# Patient Record
Sex: Female | Born: 1965 | Hispanic: No | Marital: Married | Smoking: Never smoker
Health system: Southern US, Community
[De-identification: ages and names within clinical notes are randomized; demographics above are authoritative.]

## PROBLEM LIST (undated history)

## (undated) DIAGNOSIS — R87613 High grade squamous intraepithelial lesion on cytologic smear of cervix (HGSIL): Secondary | ICD-10-CM

## (undated) DIAGNOSIS — N6019 Diffuse cystic mastopathy of unspecified breast: Secondary | ICD-10-CM

## (undated) DIAGNOSIS — G43829 Menstrual migraine, not intractable, without status migrainosus: Secondary | ICD-10-CM

## (undated) DIAGNOSIS — K635 Polyp of colon: Secondary | ICD-10-CM

## (undated) HISTORY — PX: COLPOSCOPY: SHX161

## (undated) HISTORY — DX: Diffuse cystic mastopathy of unspecified breast: N60.19

## (undated) HISTORY — DX: Polyp of colon: K63.5

## (undated) HISTORY — DX: Menstrual migraine, not intractable, without status migrainosus: G43.829

## (undated) HISTORY — DX: High grade squamous intraepithelial lesion on cytologic smear of cervix (HGSIL): R87.613

## (undated) HISTORY — PX: FOOT NEUROMA SURGERY: SHX646

---

## 1988-11-12 DIAGNOSIS — R87613 High grade squamous intraepithelial lesion on cytologic smear of cervix (HGSIL): Secondary | ICD-10-CM

## 1988-11-12 HISTORY — DX: High grade squamous intraepithelial lesion on cytologic smear of cervix (HGSIL): R87.613

## 1998-12-06 ENCOUNTER — Other Ambulatory Visit: Admission: RE | Admit: 1998-12-06 | Discharge: 1998-12-06 | Payer: Self-pay | Admitting: *Deleted

## 1999-07-14 ENCOUNTER — Other Ambulatory Visit: Admission: RE | Admit: 1999-07-14 | Discharge: 1999-07-14 | Payer: Self-pay | Admitting: *Deleted

## 1999-09-08 ENCOUNTER — Other Ambulatory Visit: Admission: RE | Admit: 1999-09-08 | Discharge: 1999-09-08 | Payer: Self-pay

## 2000-02-16 ENCOUNTER — Other Ambulatory Visit: Admission: RE | Admit: 2000-02-16 | Discharge: 2000-02-16 | Payer: Self-pay | Admitting: *Deleted

## 2000-12-13 ENCOUNTER — Other Ambulatory Visit: Admission: RE | Admit: 2000-12-13 | Discharge: 2000-12-13 | Payer: Self-pay | Admitting: *Deleted

## 2001-06-06 ENCOUNTER — Other Ambulatory Visit: Admission: RE | Admit: 2001-06-06 | Discharge: 2001-06-06 | Payer: Self-pay | Admitting: *Deleted

## 2002-03-19 ENCOUNTER — Other Ambulatory Visit: Admission: RE | Admit: 2002-03-19 | Discharge: 2002-03-19 | Payer: Self-pay | Admitting: *Deleted

## 2002-09-24 ENCOUNTER — Other Ambulatory Visit: Admission: RE | Admit: 2002-09-24 | Discharge: 2002-09-24 | Payer: Self-pay | Admitting: *Deleted

## 2003-09-08 ENCOUNTER — Encounter: Admission: RE | Admit: 2003-09-08 | Discharge: 2003-09-08 | Payer: Self-pay | Admitting: *Deleted

## 2003-09-29 ENCOUNTER — Other Ambulatory Visit: Admission: RE | Admit: 2003-09-29 | Discharge: 2003-09-29 | Payer: Self-pay | Admitting: Obstetrics and Gynecology

## 2004-10-12 ENCOUNTER — Other Ambulatory Visit: Admission: RE | Admit: 2004-10-12 | Discharge: 2004-10-12 | Payer: Self-pay | Admitting: Obstetrics and Gynecology

## 2005-12-13 ENCOUNTER — Other Ambulatory Visit: Admission: RE | Admit: 2005-12-13 | Discharge: 2005-12-13 | Payer: Self-pay | Admitting: Obstetrics & Gynecology

## 2005-12-28 ENCOUNTER — Encounter: Admission: RE | Admit: 2005-12-28 | Discharge: 2005-12-28 | Payer: Self-pay | Admitting: Obstetrics & Gynecology

## 2007-01-02 ENCOUNTER — Encounter: Admission: RE | Admit: 2007-01-02 | Discharge: 2007-01-02 | Payer: Self-pay | Admitting: Obstetrics and Gynecology

## 2007-01-04 ENCOUNTER — Other Ambulatory Visit: Admission: RE | Admit: 2007-01-04 | Discharge: 2007-01-04 | Payer: Self-pay | Admitting: Obstetrics & Gynecology

## 2007-04-15 HISTORY — PX: CERVICAL BIOPSY  W/ LOOP ELECTRODE EXCISION: SUR135

## 2007-04-16 ENCOUNTER — Ambulatory Visit (HOSPITAL_BASED_OUTPATIENT_CLINIC_OR_DEPARTMENT_OTHER): Admission: RE | Admit: 2007-04-16 | Discharge: 2007-04-16 | Payer: Self-pay | Admitting: Obstetrics and Gynecology

## 2007-04-16 ENCOUNTER — Encounter: Payer: Self-pay | Admitting: Obstetrics and Gynecology

## 2007-09-11 ENCOUNTER — Other Ambulatory Visit: Admission: RE | Admit: 2007-09-11 | Discharge: 2007-09-11 | Payer: Self-pay | Admitting: Obstetrics and Gynecology

## 2008-01-03 ENCOUNTER — Encounter: Admission: RE | Admit: 2008-01-03 | Discharge: 2008-01-03 | Payer: Self-pay | Admitting: Obstetrics and Gynecology

## 2008-01-14 ENCOUNTER — Other Ambulatory Visit: Admission: RE | Admit: 2008-01-14 | Discharge: 2008-01-14 | Payer: Self-pay | Admitting: Obstetrics and Gynecology

## 2008-05-18 ENCOUNTER — Other Ambulatory Visit: Admission: RE | Admit: 2008-05-18 | Discharge: 2008-05-18 | Payer: Self-pay | Admitting: Obstetrics and Gynecology

## 2008-11-26 ENCOUNTER — Other Ambulatory Visit: Admission: RE | Admit: 2008-11-26 | Discharge: 2008-11-26 | Payer: Self-pay | Admitting: Obstetrics and Gynecology

## 2009-05-18 ENCOUNTER — Encounter: Admission: RE | Admit: 2009-05-18 | Discharge: 2009-05-18 | Payer: Self-pay | Admitting: Obstetrics and Gynecology

## 2010-05-25 ENCOUNTER — Encounter: Admission: RE | Admit: 2010-05-25 | Discharge: 2010-05-25 | Payer: Self-pay | Admitting: Obstetrics and Gynecology

## 2010-12-27 NOTE — Op Note (Signed)
Vanessa Mckee, Vanessa Mckee               ACCOUNT NO.:  [id number]   MEDICAL RECORD NO.:  [id number]          PATIENT TYPE:  AMB   LOCATION:  [facility]                         FACILITY:  [facility]   PHYSICIAN:  Vanessa Mckee, M.D.DATE OF BIRTH:  Sep 04, 1965   DATE OF PROCEDURE:  04/16/2007  DATE OF DISCHARGE:                               OPERATIVE REPORT   PREOPERATIVE DIAGNOSIS:  Recurrent CIN-III.  Patient anxiety precludes  office procedure.   POSTOPERATIVE DIAGNOSIS:  Recurrent CIN-III.  Patient anxiety precludes  office procedure.  Path pending.   PROCEDURE PERFORMED:  Loupe electro excisional procedure.   SURGEON:  Vanessa Mckee, M.D.   ANESTHESIA:  General by laryngeal mask anesthesia.   ESTIMATED BLOOD LOSS:  Minimal.   COMPLICATIONS:  None.   PROCEDURE IN DETAIL:  The patient was taken to the operating room and  after the induction of general anesthesia by laryngeal mask anesthesia  was placed in the dorsal lithotomy position and prepped and draped in  the usual fashion.  A coated speculum was inserted and the cervix was  visualized.  Colposcopy was carried out.  The transformation zone was  clearly seen as were the abnormal areas that had been seen on previous  colposcopy.  Lugol solution was applied and the non-staining areas were  noted.  A 20 x 12 loop was used to excise the abnormal areas and the  entire transformation zone.  The settings on the machine were 60 cut and  50 coag.  The bed of the LEEP was then coagulated with a ball electrode  for hemostasis.  The specimen was opened at 9 o'clock and pinned out on  cork and sent to pathology.  The procedure was terminated.  The  instrument was removed from the patient's vagina and she went to the  recovery room in satisfactory condition.      Vanessa Mckee, M.D.  Electronically Signed     CPR/MEDQ  D:  04/16/2007  T:  04/16/2007  Job:  [id number]

## 2011-05-11 ENCOUNTER — Other Ambulatory Visit: Payer: Self-pay | Admitting: Obstetrics and Gynecology

## 2011-05-11 DIAGNOSIS — Z1231 Encounter for screening mammogram for malignant neoplasm of breast: Secondary | ICD-10-CM

## 2011-05-26 LAB — CBC
HCT: 42.1
Hemoglobin: 14.5
MCHC: 34.4
MCV: 86.6
Platelets: 163
RBC: 4.87
RDW: 12.7
WBC: 5.1

## 2011-05-26 LAB — POCT PREGNANCY, URINE
Operator id: 268271
Preg Test, Ur: NEGATIVE

## 2011-05-29 ENCOUNTER — Ambulatory Visit
Admission: RE | Admit: 2011-05-29 | Discharge: 2011-05-29 | Disposition: A | Discharge status: Home / Self Care | Payer: BC Managed Care – PPO | Source: Ambulatory Visit | Attending: Obstetrics and Gynecology | Admitting: Obstetrics and Gynecology

## 2011-05-29 DIAGNOSIS — Z1231 Encounter for screening mammogram for malignant neoplasm of breast: Secondary | ICD-10-CM

## 2012-06-21 ENCOUNTER — Other Ambulatory Visit: Payer: Self-pay | Admitting: Obstetrics and Gynecology

## 2012-06-21 DIAGNOSIS — Z1231 Encounter for screening mammogram for malignant neoplasm of breast: Secondary | ICD-10-CM

## 2012-08-02 ENCOUNTER — Ambulatory Visit
Admission: RE | Admit: 2012-08-02 | Discharge: 2012-08-02 | Disposition: A | Discharge status: Home / Self Care | Payer: BC Managed Care – PPO | Source: Ambulatory Visit | Attending: Obstetrics and Gynecology | Admitting: Obstetrics and Gynecology

## 2012-08-02 DIAGNOSIS — Z1231 Encounter for screening mammogram for malignant neoplasm of breast: Secondary | ICD-10-CM

## 2013-08-01 ENCOUNTER — Other Ambulatory Visit: Payer: Self-pay

## 2013-08-01 DIAGNOSIS — Z1231 Encounter for screening mammogram for malignant neoplasm of breast: Secondary | ICD-10-CM

## 2013-08-08 ENCOUNTER — Ambulatory Visit
Admission: RE | Admit: 2013-08-08 | Discharge: 2013-08-08 | Disposition: A | Discharge status: Home / Self Care | Payer: BC Managed Care – PPO | Source: Ambulatory Visit

## 2013-08-08 DIAGNOSIS — Z1231 Encounter for screening mammogram for malignant neoplasm of breast: Secondary | ICD-10-CM

## 2014-07-03 ENCOUNTER — Other Ambulatory Visit: Payer: Self-pay

## 2014-07-03 DIAGNOSIS — Z1231 Encounter for screening mammogram for malignant neoplasm of breast: Secondary | ICD-10-CM

## 2014-08-10 ENCOUNTER — Other Ambulatory Visit: Payer: Self-pay

## 2014-08-10 ENCOUNTER — Ambulatory Visit
Admission: RE | Admit: 2014-08-10 | Discharge: 2014-08-10 | Disposition: A | Discharge status: Home / Self Care | Payer: PRIVATE HEALTH INSURANCE | Source: Ambulatory Visit

## 2014-08-10 DIAGNOSIS — Z1231 Encounter for screening mammogram for malignant neoplasm of breast: Secondary | ICD-10-CM

## 2014-08-26 ENCOUNTER — Encounter: Payer: Self-pay | Admitting: Nurse Practitioner

## 2014-08-26 ENCOUNTER — Ambulatory Visit (INDEPENDENT_AMBULATORY_CARE_PROVIDER_SITE_OTHER): Payer: No Typology Code available for payment source | Admitting: Nurse Practitioner

## 2014-08-26 VITALS — BP 124/80 | HR 76 | Ht 62.0 in | Wt 132.0 lb

## 2014-08-26 DIAGNOSIS — Z01419 Encounter for gynecological examination (general) (routine) without abnormal findings: Secondary | ICD-10-CM

## 2014-08-26 DIAGNOSIS — Z0000 Encounter for general adult medical examination without abnormal findings: Secondary | ICD-10-CM

## 2014-08-26 LAB — POCT URINALYSIS DIPSTICK
Bilirubin, UA: NEGATIVE
Blood, UA: NEGATIVE
Glucose, UA: NEGATIVE
Ketones, UA: NEGATIVE
Leukocytes, UA: NEGATIVE
Nitrite, UA: NEGATIVE
Protein, UA: NEGATIVE
Urobilinogen, UA: NEGATIVE
pH, UA: 5

## 2014-08-26 LAB — HEMOGLOBIN, FINGERSTICK: Hemoglobin, fingerstick: 14.5 g/dL (ref 12.0–16.0)

## 2014-08-26 MED ORDER — ESTRADIOL 0.1 MG/24HR TD PTTW: 1.0000 | MEDICATED_PATCH | TRANSDERMAL | Status: DC

## 2014-08-26 NOTE — Patient Instructions (Signed)

## 2014-08-26 NOTE — Progress Notes (Signed)
Patient ID: Vanessa Mckee, female   DOB: 1966/08/14, 49 y.o.   MRN: [id number] 49 y.o. G0 Married  Caucasian Fe here for annual exam.  Menses lasting 2-3 days light to spotting.  Usually will take Vivelle patch 1 -2 times on off week of pills to help with migraine HA's.  Patient's last menstrual period was 08/19/2014.          Sexually active: Yes.    The current method of family planning is OCP (estrogen/progesterone).    Exercising: Yes.    Gym/ health club routine includes spin, weights, other cardio. Smoker:  no  Health Maintenance: Pap:  2 years ago, normal; history of CIN III with LEEP 04/2007, other abnormal see history MMG:  08/10/14, Bi-Rads 1:  Negative  TDaP:  01/14/08 Labs:  HB:  14.5  Urine:  Negative   reports that she has never smoked. She has never used smokeless tobacco. She reports that she does not drink alcohol or use illicit drugs.  Past Medical History  Diagnosis Date  . Fibrocystic breast disease (FCBD)   . Menstrual migraine   . Pap smear abnormality of cervix with HGSIL 4/90    CIN III laser vap  03/23/1989  . Pap smear abnormality of cervix with HGSIL 1/92 - 2/92    persistent CIN II treated with Efudex  . Pap smear abnormality of cervix with HGSIL 04/2007    LEEP for recurent CIN III    Past Surgical History  Procedure Laterality Date  . Cervical biopsy  w/ loop electrode excision  9/08    recurrent CIN-III  . Foot neuroma surgery Left   . Colposcopy      Current Outpatient Prescriptions  Medication Sig Dispense Refill  . ALPRAZolam (XANAX) 0.5 MG tablet Take 0.25 mg by mouth as needed.   0  . MONONESSA 0.25-35 MG-MCG tablet Take 1 tablet by mouth daily.  12  . estradiol (VIVELLE-DOT) 0.1 MG/24HR patch Place 1 patch (0.1 mg total) onto the skin 2 (two) times a week. 8 patch 12   No current facility-administered medications for this visit.    Family History  Problem Relation Age of Onset  . Hypertrophic cardiomyopathy Mother   . Hypertension  Mother   . Hypertension Father   . Heart disease Father   . Hypertrophic cardiomyopathy Maternal Aunt   . Hypertrophic cardiomyopathy Maternal Grandfather     unsure if diagnosed  . Heart failure Paternal Grandmother   . Cancer Paternal Grandfather 62    renal or stomach    ROS:  Pertinent items are noted in HPI.  Otherwise, a comprehensive ROS was negative.  Exam:   BP 124/80 mmHg  Pulse 76  Ht  (1.575 m)  Wt 132 lb (59.875 kg)  BMI 24.14 kg/m2  LMP 08/19/2014 Height:  (157.5 cm) Ht Readings from Last 3 Encounters:  08/26/14  (1.575 m)    General appearance: alert, cooperative and appears stated age Head: Normocephalic, without obvious abnormality, atraumatic Neck: no adenopathy, supple, symmetrical, trachea midline and thyroid normal to inspection and palpation Lungs: clear to auscultation bilaterally Breasts: normal appearance, no masses or tenderness Heart: regular rate and rhythm Abdomen: soft, non-tender; no masses,  no organomegaly Extremities: extremities normal, atraumatic, no cyanosis or edema Skin: Skin color, texture, turgor normal. No rashes or lesions Lymph nodes: Cervical, supraclavicular, and axillary nodes normal. No abnormal inguinal nodes palpated Neurologic: Grossly normal   Pelvic: External genitalia:  no lesions  Urethra:  normal appearing urethra with no masses, tenderness or lesions              Bartholin's and Skene's: normal                 Vagina: normal appearing vagina with normal color and discharge, no lesions              Cervix: anteverted              Pap taken: Yes.   Bimanual Exam:  Uterus:  normal size, contour, position, consistency, mobility, non-tender              Adnexa: no mass, fullness, tenderness               Rectovaginal: Confirms               Anus:  normal sphincter tone, no lesions  Chaperone present: Yes  A:  Well Woman with normal exam  Contraception with OCP  History of abnormal pap -  see history, last treatment wit LEEP 04/2007 for CIN III   P:   Reviewed health and wellness pertinent to exam  Pap smear taken today  Mammogram is due 07/2015  Refill on Monoessa for a year  Refill on Vivelle dot for a year  Counseled on breast self exam, mammography screening, use and side effects of OCP's, adequate intake of calcium and vitamin D, diet and exercise return annually or prn  An After Visit Summary was printed and given to the patient.

## 2014-08-30 NOTE — Progress Notes (Signed)
Encounter reviewed by Dr. Lacresha Fusilier Silva.  

## 2014-08-31 LAB — IPS PAP TEST WITH HPV

## 2014-11-09 ENCOUNTER — Other Ambulatory Visit: Payer: Self-pay | Admitting: Nurse Practitioner

## 2014-11-09 MED ORDER — NORGESTIMATE-ETH ESTRADIOL 0.25-35 MG-MCG PO TABS: 1.0000 | ORAL_TABLET | Freq: Every day | ORAL | Status: DC

## 2014-11-09 NOTE — Telephone Encounter (Signed)
Patient calling requesting refills on Mononessa. Pharmacy on file is correct.

## 2014-11-09 NOTE — Telephone Encounter (Signed)
Medication refill request: Mononessa Last AEX:  08/26/14 PG Next AEX: 09/02/15 PG Last MMG (if hormonal medication request): 08/10/14 BIRADS1:Neg Refill authorized: please advise

## 2014-11-30 ENCOUNTER — Telehealth: Payer: Self-pay | Admitting: Nurse Practitioner

## 2014-11-30 NOTE — Telephone Encounter (Signed)
Left message regarding upcoming appointment has been canceled and needs to be rescheduled. °

## 2015-07-20 ENCOUNTER — Other Ambulatory Visit: Payer: Self-pay

## 2015-07-20 DIAGNOSIS — Z1231 Encounter for screening mammogram for malignant neoplasm of breast: Secondary | ICD-10-CM

## 2015-08-25 ENCOUNTER — Ambulatory Visit
Admission: RE | Admit: 2015-08-25 | Discharge: 2015-08-25 | Disposition: A | Discharge status: Home / Self Care | Payer: BLUE CROSS/BLUE SHIELD | Source: Ambulatory Visit

## 2015-08-25 DIAGNOSIS — Z1231 Encounter for screening mammogram for malignant neoplasm of breast: Secondary | ICD-10-CM

## 2015-09-02 ENCOUNTER — Ambulatory Visit: Payer: No Typology Code available for payment source | Admitting: Nurse Practitioner

## 2015-09-08 ENCOUNTER — Ambulatory Visit (INDEPENDENT_AMBULATORY_CARE_PROVIDER_SITE_OTHER): Payer: BLUE CROSS/BLUE SHIELD | Admitting: Nurse Practitioner

## 2015-09-08 ENCOUNTER — Encounter: Payer: Self-pay | Admitting: Nurse Practitioner

## 2015-09-08 VITALS — BP 120/74 | HR 76 | Ht 62.0 in | Wt 131.0 lb

## 2015-09-08 DIAGNOSIS — Z0000 Encounter for general adult medical examination without abnormal findings: Secondary | ICD-10-CM

## 2015-09-08 DIAGNOSIS — Z01419 Encounter for gynecological examination (general) (routine) without abnormal findings: Secondary | ICD-10-CM

## 2015-09-08 DIAGNOSIS — Z1211 Encounter for screening for malignant neoplasm of colon: Secondary | ICD-10-CM

## 2015-09-08 LAB — COMPREHENSIVE METABOLIC PANEL
ALT: 16 U/L (ref 6–29)
AST: 19 U/L (ref 10–35)
Albumin: 4.1 g/dL (ref 3.6–5.1)
Alkaline Phosphatase: 48 U/L (ref 33–115)
BUN: 18 mg/dL (ref 7–25)
CO2: 26 mmol/L (ref 20–31)
Calcium: 8.9 mg/dL (ref 8.6–10.2)
Chloride: 103 mmol/L (ref 98–110)
Creat: 0.82 mg/dL (ref 0.50–1.10)
Glucose, Bld: 98 mg/dL (ref 65–99)
Potassium: 4.5 mmol/L (ref 3.5–5.3)
Sodium: 136 mmol/L (ref 135–146)
Total Bilirubin: 0.4 mg/dL (ref 0.2–1.2)
Total Protein: 6.6 g/dL (ref 6.1–8.1)

## 2015-09-08 LAB — LIPID PANEL
Cholesterol: 135 mg/dL (ref 125–200)
HDL: 59 mg/dL (ref >=46)
LDL Cholesterol: 51 mg/dL (ref <130)
Total CHOL/HDL Ratio: 2.3 Ratio (ref <=5.0)
Triglycerides: 126 mg/dL (ref <150)
VLDL: 25 mg/dL (ref <30)

## 2015-09-08 LAB — HIV ANTIBODY (ROUTINE TESTING W REFLEX): HIV 1&2 Ab, 4th Generation: NONREACTIVE

## 2015-09-08 LAB — CBC WITH DIFFERENTIAL/PLATELET
BASOS ABS: 0 10*3/uL (ref 0.0–0.1)
Basophils Relative: 0 % (ref 0–1)
EOS PCT: 1 % (ref 0–5)
Eosinophils Absolute: 0.1 10*3/uL (ref 0.0–0.7)
HEMATOCRIT: 42.4 % (ref 36.0–46.0)
HEMOGLOBIN: 14.8 g/dL (ref 12.0–15.0)
LYMPHS ABS: 1.1 10*3/uL (ref 0.7–4.0)
LYMPHS PCT: 15 % (ref 12–46)
MCH: 30.6 pg (ref 26.0–34.0)
MCHC: 34.9 g/dL (ref 30.0–36.0)
MCV: 87.8 fL (ref 78.0–100.0)
MPV: 10.2 fL (ref 8.6–12.4)
Monocytes Absolute: 0.5 10*3/uL (ref 0.1–1.0)
Monocytes Relative: 7 % (ref 3–12)
Neutro Abs: 5.8 10*3/uL (ref 1.7–7.7)
Neutrophils Relative %: 77 % (ref 43–77)
Platelets: 161 10*3/uL (ref 150–400)
RBC: 4.83 MIL/uL (ref 3.87–5.11)
RDW: 13.2 % (ref 11.5–15.5)
WBC: 7.5 10*3/uL (ref 4.0–10.5)

## 2015-09-08 LAB — HEMOGLOBIN, FINGERSTICK: Hemoglobin, fingerstick: 14 g/dL (ref 12.0–16.0)

## 2015-09-08 LAB — POCT URINALYSIS DIPSTICK
Bilirubin, UA: NEGATIVE
Blood, UA: NEGATIVE
Glucose, UA: NEGATIVE
KETONES UA: NEGATIVE
Leukocytes, UA: NEGATIVE
Nitrite, UA: NEGATIVE
PROTEIN UA: NEGATIVE
Urobilinogen, UA: NEGATIVE
pH, UA: 5

## 2015-09-08 LAB — TSH: TSH: 2.536 u[IU]/mL (ref 0.350–4.500)

## 2015-09-08 MED ORDER — ESTRADIOL 0.1 MG/24HR TD PTTW: 1.0000 | MEDICATED_PATCH | TRANSDERMAL | Status: DC

## 2015-09-08 MED ORDER — NORGESTIMATE-ETH ESTRADIOL 0.25-35 MG-MCG PO TABS: 1.0000 | ORAL_TABLET | Freq: Every day | ORAL | Status: DC

## 2015-09-08 MED ORDER — ALPRAZOLAM 0.5 MG PO TABS: 0.2500 mg | ORAL_TABLET | ORAL | Status: DC | PRN

## 2015-09-08 NOTE — Progress Notes (Signed)
Patient ID: Vanessa Mckee, female   DOB: [birth year], 50 y.o.   MRN: [id number]  50 y.o. G0P0 Married  Caucasian Fe here for annual exam.  Menses is usually 2-3 days and very light.  No cramps.  HA's are much better with use of Vivelle patch - usually only needs 1 patch per cycle.  Patient's last menstrual period was 08/18/2015 (exact date).          Sexually active: Yes.    The current method of family planning is OCP (estrogen/progesterone).    Exercising: Yes.    weights and cardio Smoker:  no  Health Maintenance: Pap: 08/26/14, negative with neg HR HPV; history of CIN III with LEEP 04/2007, other abnormal see history MMG:08/25/15, 3D, Bi-Rads 1: Negative  TDaP: 01/14/08  HIV: discussed today Labs: HB: 14.0, also wants fasting labs  Urine: Negative   reports that she has never smoked. She has never used smokeless tobacco. She reports that she does not drink alcohol or use illicit drugs.  Past Medical History  Diagnosis Date  . Fibrocystic breast disease (FCBD)   . Menstrual migraine   . Pap smear abnormality of cervix with HGSIL 4/90    CIN III laser vap  03/23/1989  . Pap smear abnormality of cervix with HGSIL 1/92 - 2/92    persistent CIN II treated with Efudex  . Pap smear abnormality of cervix with HGSIL 04/2007    LEEP for recurent CIN III    Past Surgical History  Procedure Laterality Date  . Cervical biopsy  w/ loop electrode excision  9/08    recurrent CIN-III  . Foot neuroma surgery Left   . Colposcopy      Current Outpatient Prescriptions  Medication Sig Dispense Refill  . ALPRAZolam (XANAX) 0.5 MG tablet Take 0.5 tablets (0.25 mg total) by mouth as needed. 30 tablet 0  . estradiol (VIVELLE-DOT) 0.1 MG/24HR patch Place 1 patch (0.1 mg total) onto the skin 2 (two) times a week. 8 patch 12  . norgestimate-ethinyl estradiol (MONONESSA) 0.25-35 MG-MCG tablet Take 1 tablet by mouth daily. 3 Package 4  . triamcinolone cream (KENALOG) 0.1 % as directed.  2   No  current facility-administered medications for this visit.    Family History  Problem Relation Age of Onset  . Hypertrophic cardiomyopathy Mother   . Hypertension Mother   . Hypertension Father   . Heart disease Father   . Hypertrophic cardiomyopathy Maternal Aunt   . Hypertrophic cardiomyopathy Maternal Grandfather     unsure if diagnosed  . Heart failure Paternal Grandmother   . Cancer Paternal Grandfather 58    renal or stomach    ROS:  Pertinent items are noted in HPI.  Otherwise, a comprehensive ROS was negative.  Exam:   BP 120/74 mmHg  Pulse 76  Ht  (1.575 m)  Wt 131 lb (59.421 kg)  BMI 23.95 kg/m2  LMP 08/18/2015 (Exact Date) Height:  (157.5 cm) Ht Readings from Last 3 Encounters:  09/08/15  (1.575 m)  08/26/14  (1.575 m)    General appearance: alert, cooperative and appears stated age Head: Normocephalic, without obvious abnormality, atraumatic Neck: no adenopathy, supple, symmetrical, trachea midline and thMASSIE Mckee to inspection and palpation Lungs: clear to auscultation bilaterally Breasts: normal appearance, no masses or tenderness Heart: regular rate and rhythm Abdomen: soft, non-tender; no masses,  no organomegaly Extremities: extremities normal, atraumatic, no cyanosis or edema Skin: Skin color, texture, turgor normal. No rashes or lesions  Lymph nodes: Cervical, supraclavicular, and axillary nodes normal. No abnormal inguinal nodes palpated Neurologic: Grossly normal   Pelvic: External genitalia:  no lesions              Urethra:  normal appearing urethra with no masses, tenderness or lesions              Bartholin's and Skene's: normal                 Vagina: normal appearing vagina with normal color and discharge, no lesions              Cervix: anteverted              Pap taken: No. Bimanual Exam:  Uterus:  normal size, contour, position, consistency, mobility, non-tender              Adnexa: no mass, fullness, tenderness                Rectovaginal: Confirms               Anus:  normal sphincter tone, no lesions  Chaperone present: no  A:  Well Woman with normal exam  Contraception with OCP  History of migraine HA's with menses - does well on Vivelle patch. History of abnormal pap - see history, last treatment wit LEEP 04/2007 for CIN III   P:   Reviewed health and wellness pertinent to exam  Pap smear as above  Mammogram is due 08/2016  Refill on [facility] for a year  Refill on Vivelle patch to use only prn during menses  Refill on Xanax 0.5 mg to use only 1/2 tab with travel or exams  Follow up on labs  Will get referral to GI for screening colonoscopy  Counseled on breast self exam, mammography screening, use and side effects of OCP's, use and side effects of HRT, adequate intake of calcium and vitamin D, diet and exercise return annually or prn  An After Visit Summary was printed and given to the patient.

## 2015-09-08 NOTE — Patient Instructions (Signed)

## 2015-09-09 LAB — VITAMIN D 25 HYDROXY (VIT D DEFICIENCY, FRACTURES): Vit D, 25-Hydroxy: 42 ng/mL (ref 30–100)

## 2015-09-13 ENCOUNTER — Telehealth: Payer: Self-pay | Admitting: Nurse Practitioner

## 2015-09-13 NOTE — Progress Notes (Signed)
Encounter reviewed by Dr. Brook Amundson C. Silva.  

## 2015-09-13 NOTE — Telephone Encounter (Signed)
Spoke with patient and conveyed appointment information for specialist

## 2015-11-17 ENCOUNTER — Encounter: Payer: Self-pay | Admitting: Nurse Practitioner

## 2016-02-01 DIAGNOSIS — H5213 Myopia, bilateral: Secondary | ICD-10-CM | POA: Diagnosis not present

## 2016-04-04 DIAGNOSIS — Z1211 Encounter for screening for malignant neoplasm of colon: Secondary | ICD-10-CM | POA: Diagnosis not present

## 2016-04-27 DIAGNOSIS — L720 Epidermal cyst: Secondary | ICD-10-CM | POA: Diagnosis not present

## 2016-06-30 DIAGNOSIS — D123 Benign neoplasm of transverse colon: Secondary | ICD-10-CM | POA: Diagnosis not present

## 2016-06-30 DIAGNOSIS — K621 Rectal polyp: Secondary | ICD-10-CM | POA: Diagnosis not present

## 2016-06-30 DIAGNOSIS — Z1211 Encounter for screening for malignant neoplasm of colon: Secondary | ICD-10-CM | POA: Diagnosis not present

## 2016-06-30 DIAGNOSIS — K635 Polyp of colon: Secondary | ICD-10-CM | POA: Diagnosis not present

## 2016-06-30 DIAGNOSIS — K6389 Other specified diseases of intestine: Secondary | ICD-10-CM | POA: Diagnosis not present

## 2016-06-30 DIAGNOSIS — D128 Benign neoplasm of rectum: Secondary | ICD-10-CM | POA: Diagnosis not present

## 2016-06-30 HISTORY — DX: Polyp of colon: K63.5

## 2016-07-28 ENCOUNTER — Other Ambulatory Visit: Payer: Self-pay | Admitting: Nurse Practitioner

## 2016-07-28 DIAGNOSIS — Z1231 Encounter for screening mammogram for malignant neoplasm of breast: Secondary | ICD-10-CM

## 2016-09-05 ENCOUNTER — Ambulatory Visit
Admission: RE | Admit: 2016-09-05 | Discharge: 2016-09-05 | Disposition: A | Discharge status: Home / Self Care | Payer: BLUE CROSS/BLUE SHIELD | Source: Ambulatory Visit | Attending: Nurse Practitioner | Admitting: Nurse Practitioner

## 2016-09-05 DIAGNOSIS — Z1231 Encounter for screening mammogram for malignant neoplasm of breast: Secondary | ICD-10-CM

## 2016-09-07 ENCOUNTER — Other Ambulatory Visit: Payer: Self-pay | Admitting: Nurse Practitioner

## 2016-09-07 DIAGNOSIS — L814 Other melanin hyperpigmentation: Secondary | ICD-10-CM | POA: Diagnosis not present

## 2016-09-07 DIAGNOSIS — L820 Inflamed seborrheic keratosis: Secondary | ICD-10-CM | POA: Diagnosis not present

## 2016-09-07 DIAGNOSIS — L853 Xerosis cutis: Secondary | ICD-10-CM | POA: Diagnosis not present

## 2016-09-07 DIAGNOSIS — L720 Epidermal cyst: Secondary | ICD-10-CM | POA: Diagnosis not present

## 2016-09-07 NOTE — Telephone Encounter (Signed)
Medication refill request: Norgestimate-Ethinyl Last AEX:  09/08/15 PG Next AEX: 09/18/16 PG Last MMG (if hormonal medication request): 09/05/16 Blake DivineBIRADS1, D[facility] Refill authorized: 09/08/15 #3 Packages 4R. Please advise. Thank you.

## 2016-09-18 ENCOUNTER — Ambulatory Visit (INDEPENDENT_AMBULATORY_CARE_PROVIDER_SITE_OTHER): Payer: BLUE CROSS/BLUE SHIELD | Admitting: Nurse Practitioner

## 2016-09-18 ENCOUNTER — Encounter: Payer: Self-pay | Admitting: Nurse Practitioner

## 2016-09-18 VITALS — BP 122/80 | HR 72 | Ht 61.75 in | Wt 126.0 lb

## 2016-09-18 DIAGNOSIS — Z0000 Encounter for general adult medical examination without abnormal findings: Secondary | ICD-10-CM

## 2016-09-18 DIAGNOSIS — Z01419 Encounter for gynecological examination (general) (routine) without abnormal findings: Secondary | ICD-10-CM

## 2016-09-18 LAB — LIPID PANEL
CHOLESTEROL: 164 mg/dL (ref <200)
HDL: 68 mg/dL (ref >50)
LDL CALC: 82 mg/dL (ref <100)
Total CHOL/HDL Ratio: 2.4 Ratio (ref <5.0)
Triglycerides: 69 mg/dL (ref <150)
VLDL: 14 mg/dL (ref <30)

## 2016-09-18 LAB — CBC
HEMATOCRIT: 43.5 % (ref 35.0–45.0)
Hemoglobin: 14.9 g/dL (ref 11.7–15.5)
MCH: 30 pg (ref 27.0–33.0)
MCHC: 34.3 g/dL (ref 32.0–36.0)
MCV: 87.5 fL (ref 80.0–100.0)
MPV: 10.3 fL (ref 7.5–12.5)
Platelets: 186 10*3/uL (ref 140–400)
RBC: 4.97 MIL/uL (ref 3.80–5.10)
RDW: 13.2 % (ref 11.0–15.0)
WBC: 5.4 10*3/uL (ref 3.8–10.8)

## 2016-09-18 LAB — POCT URINALYSIS DIPSTICK
Bilirubin, UA: NEGATIVE
GLUCOSE UA: NEGATIVE
Ketones, UA: NEGATIVE
Leukocytes, UA: NEGATIVE
Nitrite, UA: NEGATIVE
PH UA: 5
Protein, UA: NEGATIVE
RBC UA: NEGATIVE
UROBILINOGEN UA: NEGATIVE

## 2016-09-18 LAB — TSH: TSH: 1.48 mIU/L

## 2016-09-18 LAB — VITAMIN D 25 HYDROXY (VIT D DEFICIENCY, FRACTURES): VIT D 25 HYDROXY: 37 ng/mL (ref 30–100)

## 2016-09-18 LAB — COMPREHENSIVE METABOLIC PANEL
ALT: 15 U/L (ref 6–29)
AST: 19 U/L (ref 10–35)
Albumin: 4.4 g/dL (ref 3.6–5.1)
Alkaline Phosphatase: 50 U/L (ref 33–130)
BUN: 22 mg/dL (ref 7–25)
CHLORIDE: 103 mmol/L (ref 98–110)
CO2: 30 mmol/L (ref 20–31)
CREATININE: 0.77 mg/dL (ref 0.50–1.05)
Calcium: 9.4 mg/dL (ref 8.6–10.4)
Glucose, Bld: 99 mg/dL (ref 65–99)
Potassium: 4.5 mmol/L (ref 3.5–5.3)
SODIUM: 139 mmol/L (ref 135–146)
TOTAL PROTEIN: 6.6 g/dL (ref 6.1–8.1)
Total Bilirubin: 0.9 mg/dL (ref 0.2–1.2)

## 2016-09-18 LAB — HEMOGLOBIN, FINGERSTICK: HEMOGLOBIN, FINGERSTICK: 15.1 g/dL — AB (ref 12.0–15.0)

## 2016-09-18 MED ORDER — ESTRADIOL 0.1 MG/24HR TD PTTW: MEDICATED_PATCH | TRANSDERMAL | 12 refills | Status: DC

## 2016-09-18 MED ORDER — ALPRAZOLAM 0.5 MG PO TABS: 0.2500 mg | ORAL_TABLET | ORAL | 0 refills | Status: DC | PRN

## 2016-09-18 MED ORDER — NORGESTIMATE-ETH ESTRADIOL 0.25-35 MG-MCG PO TABS: 1.0000 | ORAL_TABLET | Freq: Every day | ORAL | 4 refills | Status: DC

## 2016-09-18 NOTE — Progress Notes (Signed)
Patient ID: Vanessa Mckee, female   DOB: [birth year], 51 y.o.   MRN: [id number]  51 y.o. G0P0000 Married  Caucasian Fe here for annual exam.  Now menses 3-5 days. Light to spotting.  No cramps, no PMS.  Headaches are managed with Vivelle patch either 1-2 per week of menses.    Patient's last menstrual period was 09/12/2016 (exact date).          Sexually active: Yes.    The current method of family planning is OCP (estrogen/progesterone).    Exercising: Yes.    Gym/ health club routine includes weights, cardio and yoga 6 times per week. Smoker:  no  Health Maintenance: Pap: 08/26/14, negative with neg HR HPV; history of CIN III with LEEP 04/2007, other abnormal see history MMG:09/05/16, 3D, Bi-Rads 1:Negative Colonoscopy: 06/30/16, Hyperplastic polyp, repeat in 10 years TDaP: 01/14/08  HIV: 09/08/15 Labs: HB: 15.1   Urine: Negative   reports that she has never smoked. She has never used smokeless tobacco. She reports that she does not drink alcohol or use drugs.  Past Medical History:  Diagnosis Date  . Fibrocystic breast disease (FCBD)   . Hyperplastic colon polyp 06/30/2016  . Menstrual migraine   . 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 Pap smear abnormality of cervix with HGSIL 1/92 - 2/92   persistent CIN II treated with Efudex  . Pap smear abnormality of cervix with HGSIL 04/2007   LEEP for recurent CIN III    Past Surgical History:  Procedure Laterality Date  . CERVICAL BIOPSY  W/ LOOP ELECTRODE EXCISION  9/08   recurrent CIN-III  . COLPOSCOPY    . FOOT NEUROMA SURGERY Left     Current Outpatient Prescriptions  Medication Sig Dispense Refill  . ALPRAZolam (XANAX) 0.5 MG tablet Take 0.5 tablets (0.25 mg total) by mouth as needed. 30 tablet 0  . estradiol (VIVELLE-DOT) 0.1 MG/24HR patch Place 1 patch (0.1 mg total) onto the skin 2 (two) times a week. 8 patch 12  . MONONESSA 0.25-35 MG-MCG tablet TAKE 1 TABLET BY MOUTH EVERY DAY  84 tablet 0  . triamcinolone cream (KENALOG) 0.1 % as directed.  2   No current facility-administered medications for this visit.     Family History  Problem Relation Age of Onset  . Hypertrophic cardiomyopathy Mother   . Hypertension Mother   . Hypertension Father   . Heart disease Father   . Hypertrophic cardiomyopathy Maternal Grandfather     unsure if diagnosed  . Heart failure Paternal Grandmother   . Cancer Paternal Grandfather [phone]    renal or stomach  . Hypertrophic cardiomyopathy Maternal Aunt     ROS:  Pertinent items are noted in HPI.  Otherwise, a comprehensive ROS was negative.  Exam:   BP 122/80 (BP Location: Right Arm, Patient Position: Sitting, Cuff Size: Normal)   Pulse 72   Ht 5' 1.75" (1.568 m)   Wt 126 lb (57.2 kg)   LMP 09/12/2016 (Exact Date)   BMI 23.23 kg/m  Height: 5' 1.75" (156.8 cm) Ht Readings from Last 3 Encounters:  09/18/16 5' 1.75" (1.568 m)  09/08/15 5\' 2"  (1.575 m)  08/26/14 5\' 2"  (1.575 m)    General appearance: alert, cooperative and appears stated age Head: Normocephalic, without obvious abnormality, atraumatic Neck: no adenopathy, supple, symmetrical, trachea midline and thyroid normal to inspection and palpation Lungs: clear to auscultation bilaterally Breasts: normal appearance, no masses or  tenderness Heart: regular rate and rhythm Abdomen: soft, non-tender; no masses,  no organomegaly Extremities: extremities normal, atraumatic, no cyanosis or edema Skin: Skin color, texture, turgor normal. No rashes or lesions Lymph nodes: Cervical, supraclavicular, and axillary nodes normal. No abnormal inguinal nodes palpated Neurologic: Grossly normal   Pelvic: External genitalia:  no lesions              Urethra:  normal appearing urethra with no masses, tenderness or lesions              Bartholin's and Skene's: normal                 Vagina: normal appearing vagina with normal color and brown discharge from menses, no lesions               Cervix: anteverted              Pap taken: No. Bimanual Exam:  Uterus:  normal size, contour, position, consistency, mobility, non-tender              Adnexa: no mass, fullness, tenderness               Rectovaginal: Confirms               Anus:  normal sphincter tone, no lesions  Chaperone present: no  A:  Well Woman with normal exam  Contraception with OCP             History of migraine HA's with menses - does well on Vivelle patch. History of abnormal pap - see history, last treatment wit LEEP 04/2007 for CIN III  White coat anxiety   P:   Reviewed health and wellness pertinent to exam  Pap smear as above  Mammogram is due 08/2017  Follow with labs  Refill on OCP for a year  Refill on Vivelle dot to use 1-2 per week of menses  Refill on Xanax 0.5 - 1/2- 1  tablet prn insomnia with travel # 20/0 refill (usually enough to do for a year)  Counseled on breast self exam, mammography screening, use and side effects of OCP's, use and side effects of HRT, adequate intake of calcium and vitamin D, diet and exercise return annually or prn  An After Visit Summary was printed and given to the patient.

## 2016-09-18 NOTE — Patient Instructions (Signed)

## 2016-09-19 NOTE — Progress Notes (Signed)
[facility] [facility] [address]

## 2016-09-19 NOTE — Progress Notes (Signed)
Encounter reviewed by Dr. Brook Amundson C. Silva.  

## 2017-08-02 ENCOUNTER — Other Ambulatory Visit: Payer: Self-pay | Admitting: Certified Nurse Midwife

## 2017-08-02 DIAGNOSIS — Z139 Encounter for screening, unspecified: Secondary | ICD-10-CM

## 2017-09-06 ENCOUNTER — Ambulatory Visit
Admission: RE | Admit: 2017-09-06 | Discharge: 2017-09-06 | Disposition: A | Discharge status: Home / Self Care | Payer: BLUE CROSS/BLUE SHIELD | Source: Ambulatory Visit | Attending: Certified Nurse Midwife | Admitting: Certified Nurse Midwife

## 2017-09-06 DIAGNOSIS — Z1231 Encounter for screening mammogram for malignant neoplasm of breast: Secondary | ICD-10-CM | POA: Diagnosis not present

## 2017-09-06 DIAGNOSIS — Z139 Encounter for screening, unspecified: Secondary | ICD-10-CM

## 2017-09-11 DIAGNOSIS — L570 Actinic keratosis: Secondary | ICD-10-CM | POA: Diagnosis not present

## 2017-09-11 DIAGNOSIS — D2272 Melanocytic nevi of left lower limb, including hip: Secondary | ICD-10-CM | POA: Diagnosis not present

## 2017-09-11 DIAGNOSIS — L738 Other specified follicular disorders: Secondary | ICD-10-CM | POA: Diagnosis not present

## 2017-09-11 DIAGNOSIS — L821 Other seborrheic keratosis: Secondary | ICD-10-CM | POA: Diagnosis not present

## 2017-09-24 ENCOUNTER — Ambulatory Visit: Payer: BLUE CROSS/BLUE SHIELD | Admitting: Nurse Practitioner

## 2017-09-27 ENCOUNTER — Other Ambulatory Visit: Payer: Self-pay

## 2017-09-27 ENCOUNTER — Encounter: Payer: Self-pay | Admitting: Certified Nurse Midwife

## 2017-09-27 ENCOUNTER — Ambulatory Visit (INDEPENDENT_AMBULATORY_CARE_PROVIDER_SITE_OTHER): Payer: BLUE CROSS/BLUE SHIELD | Admitting: Certified Nurse Midwife

## 2017-09-27 ENCOUNTER — Other Ambulatory Visit (HOSPITAL_COMMUNITY)
Admission: RE | Admit: 2017-09-27 | Discharge: 2017-09-27 | Disposition: A | Discharge status: Home / Self Care | Payer: BLUE CROSS/BLUE SHIELD | Source: Ambulatory Visit | Attending: Certified Nurse Midwife | Admitting: Certified Nurse Midwife

## 2017-09-27 VITALS — BP 122/76 | HR 70 | Resp 16 | Ht 61.25 in | Wt 127.0 lb

## 2017-09-27 DIAGNOSIS — Z0000 Encounter for general adult medical examination without abnormal findings: Secondary | ICD-10-CM

## 2017-09-27 DIAGNOSIS — N951 Menopausal and female climacteric states: Secondary | ICD-10-CM

## 2017-09-27 DIAGNOSIS — Z124 Encounter for screening for malignant neoplasm of cervix: Secondary | ICD-10-CM | POA: Insufficient documentation

## 2017-09-27 DIAGNOSIS — Z23 Encounter for immunization: Secondary | ICD-10-CM | POA: Diagnosis not present

## 2017-09-27 DIAGNOSIS — R87610 Atypical squamous cells of undetermined significance on cytologic smear of cervix (ASC-US): Secondary | ICD-10-CM | POA: Diagnosis not present

## 2017-09-27 DIAGNOSIS — Z01419 Encounter for gynecological examination (general) (routine) without abnormal findings: Secondary | ICD-10-CM | POA: Diagnosis not present

## 2017-09-27 NOTE — Addendum Note (Signed)
Addended by: Verner CholLEONARD, Cameron Katayama S on: 09/27/2017 01:24 PM   Modules accepted: Orders

## 2017-09-27 NOTE — Progress Notes (Signed)
52 y.o. G0P0000 Married  Caucasian Fe here for annual exam. Periods very light and scant, no missed periods. Patient continues on OCP for cycle control and uses Vivelle Dot once weekly for migraine headache management. This has worked well for the past years. Aware that she may need to stop OCP. Screening labs if needed. Uses Xanax for anxiety attack and would like to have on hand. "Does not abuse, just works so good. No other health issues today.  Patient's last menstrual period was 09/11/2017 (exact date).          Sexually active: Yes.    The current method of family planning is OCP (estrogen/progesterone).    Exercising: Yes.    weights & cardio Smoker:  no  Health Maintenance: Pap:  08-26-14 neg HPV HR neg History of Abnormal Pap: yes MMG:  09-07-17 category c density birads 1:neg Self Breast exams: yes Colonoscopy:  2017 negative BMD:   none TDaP:  2009 Shingles: no Pneumonia: no Hep C and HIV: HIV neg 2017 Labs: yes   reports that  has never smoked. she has never used smokeless tobacco. She reports that she does not drink alcohol or use drugs.  Past Medical History:  Diagnosis Date  . Fibrocystic breast disease (FCBD)   . Hyperplastic colon polyp 06/30/2016  . Menstrual migraine   . 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 Pap smear abnormality of cervix with HGSIL 1/92 - 2/92   persistent CIN II treated with Efudex  . Pap smear abnormality of cervix with HGSIL 04/2007   LEEP for recurent CIN III    Past Surgical History:  Procedure Laterality Date  . CERVICAL BIOPSY  W/ LOOP ELECTRODE EXCISION  9/08   recurrent CIN-III  . COLPOSCOPY    . FOOT NEUROMA SURGERY Left     Current Outpatient Medications  Medication Sig Dispense Refill  . ALPRAZolam (XANAX) 0.5 MG tablet Take 0.5 tablets (0.25 mg total) by mouth as needed. 30 tablet 0  . estradiol (VIVELLE-DOT) 0.1 MG/24HR patch 1 patch 1-2 times a week during week of menses to  treat migraine HA. 8 patch 12  . norgestimate-ethinyl estradiol (MONONESSA) 0.25-35 MG-MCG tablet Take 1 tablet by mouth daily. 84 tablet 4  . triamcinolone cream (KENALOG) 0.1 % as directed.  2   No current facility-administered medications for this visit.     Family History  Problem Relation Age of Onset  . Hypertrophic cardiomyopathy Mother   . Hypertension Mother   . Hypertension Father   . Heart disease Father   . Hypertrophic cardiomyopathy Maternal Grandfather        unsure if diagnosed  . Heart failure Paternal Grandmother   . Cancer Paternal Grandfather 19       renal or stomach  . Hypertrophic cardiomyopathy Maternal Aunt     ROS:  Pertinent items are noted in HPI.  Otherwise, a comprehensive ROS was negative.  Exam:   BP 122/76   Pulse 70   Resp 16   Ht 5' 1.25" (1.556 m)   Wt 127 lb (57.6 kg)   LMP 09/11/2017 (Exact Date)   BMI 23.80 kg/m  Height: 5' 1.25" (155.6 cm) Ht Readings from Last 3 Encounters:  09/27/17 5' 1.25" (1.556 m)  09/18/16 5' 1.75" (1.568 m)  09/08/15 5\' 2"  (1.575 m)    General appearance: alert, cooperative and appears stated age Head: Normocephalic, without obvious abnormality, atraumatic Neck: no adenopathy, supple,  symmetrical, trachea midline and thyroid normal to inspection and palpation Lungs: clear to auscultation bilaterally Breasts: normal appearance, no masses or tenderness, No nipple retraction or dimpling, No nipple discharge or bleeding, No axillary or supraclavicular adenopathy Heart: regular rate and rhythm Abdomen: soft, non-tender; no masses,  no organomegaly Extremities: extremities normal, atraumatic, no cyanosis or edema Skin: Skin color, texture, turgor normal. No rashes or lesions Lymph nodes: Cervical, supraclavicular, and axillary nodes normal. No abnormal inguinal nodes palpated Neurologic: Grossly normal   Pelvic: External genitalia:  no lesions              Urethra:  normal appearing urethra with no masses,  tenderness or lesions              Bartholin's and Skene's: normal                 Vagina: normal appearing vagina with normal color and discharge, no lesions              Cervix: no bleeding following Pap, no cervical motion tenderness, no lesions and Leep appearance              Pap taken: Yes.   Bimanual Exam:  Uterus:  normal size, contour, position, consistency, mobility, non-tender and anteverted              Adnexa: normal adnexa and no mass, fullness, tenderness               Rectovaginal: Confirms               Anus:  normal sphincter tone, no lesions  Chaperone present: yes  A:  Well Woman with normal exam  Peri menopausal ? Symptoms  Contraception OCP  History of migraine headaches, some menstrual with Vivelle dot patch  Screening labs  Immunization update   P:   Reviewed health and wellness pertinent to exam  Discussed perimenopausal/menopausal etiology and bleeding expectations. Discussed recommendations of stopping OCP by age [age] and concerns of cardiovascular changes and increase of DVT.Marland Kitchen.Patient concerns discussed with management of headaches. Discussed finishing current pack and come in for labs for evaluation at the end of the placebo period week. Patient agreeable. Also discussed possible IUD if concerns with fertility still present concerns. Questions answered. Patient will not use Vivelle dot during this time for labs. If menopausal can use HRT which should help with symptoms and safer choice.  Labs: FSH,AMH,  CBC,CMP,Lipid panel,TSH, Vitamin D  Requests TDAP  Pap smear: yes   counseled on breast self exam, mammography screening, menopause, adequate intake of calcium and vitamin D, diet and exercise  return annually or prn  An After Visit Summary was printed and given to the patient.

## 2017-09-27 NOTE — Addendum Note (Signed)
Addended by: Verner CholLEONARD, Tranae Laramie S on: 09/27/2017 04:49 PM   Modules accepted: Orders

## 2017-09-28 LAB — COMPREHENSIVE METABOLIC PANEL
A/G RATIO: 2 (ref 1.2–2.2)
ALK PHOS: 51 IU/L (ref 39–117)
ALT: 15 IU/L (ref 0–32)
AST: 17 IU/L (ref 0–40)
Albumin: 4.7 g/dL (ref 3.5–5.5)
BILIRUBIN TOTAL: 0.8 mg/dL (ref 0.0–1.2)
BUN/Creatinine Ratio: 18 (ref 9–23)
BUN: 14 mg/dL (ref 6–24)
CALCIUM: 9.5 mg/dL (ref 8.7–10.2)
CO2: 24 mmol/L (ref 20–29)
Chloride: 100 mmol/L (ref 96–106)
Creatinine, Ser: 0.8 mg/dL (ref 0.57–1.00)
GFR calc Af Amer: 99 mL/min/{1.73_m2} (ref >59)
GFR, EST NON AFRICAN AMERICAN: 86 mL/min/{1.73_m2} (ref >59)
GLOBULIN, TOTAL: 2.4 g/dL (ref 1.5–4.5)
Glucose: 82 mg/dL (ref 65–99)
POTASSIUM: 4.9 mmol/L (ref 3.5–5.2)
SODIUM: 138 mmol/L (ref 134–144)
Total Protein: 7.1 g/dL (ref 6.0–8.5)

## 2017-09-28 LAB — LIPID PANEL
CHOLESTEROL TOTAL: 150 mg/dL (ref 100–199)
Chol/HDL Ratio: 2.1 ratio (ref 0.0–4.4)
HDL: 71 mg/dL (ref >39)
LDL Calculated: 59 mg/dL (ref 0–99)
Triglycerides: 98 mg/dL (ref 0–149)
VLDL CHOLESTEROL CAL: 20 mg/dL (ref 5–40)

## 2017-09-28 LAB — CBC
Hematocrit: 44.4 % (ref 34.0–46.6)
Hemoglobin: 15.1 g/dL (ref 11.1–15.9)
MCH: 29.7 pg (ref 26.6–33.0)
MCHC: 34 g/dL (ref 31.5–35.7)
MCV: 87 fL (ref 79–97)
PLATELETS: 190 10*3/uL (ref 150–379)
RBC: 5.09 x10E6/uL (ref 3.77–5.28)
RDW: 13.4 % (ref 12.3–15.4)
WBC: 8 10*3/uL (ref 3.4–10.8)

## 2017-09-28 LAB — TSH: TSH: 2.12 u[IU]/mL (ref 0.450–4.500)

## 2017-09-28 LAB — VITAMIN D 25 HYDROXY (VIT D DEFICIENCY, FRACTURES): Vit D, 25-Hydroxy: 45.3 ng/mL (ref 30.0–100.0)

## 2017-10-02 LAB — CYTOLOGY - PAP
Diagnosis: UNDETERMINED — AB
HPV: NOT DETECTED

## 2017-10-17 ENCOUNTER — Other Ambulatory Visit (INDEPENDENT_AMBULATORY_CARE_PROVIDER_SITE_OTHER): Payer: BLUE CROSS/BLUE SHIELD

## 2017-10-17 DIAGNOSIS — N951 Menopausal and female climacteric states: Secondary | ICD-10-CM | POA: Diagnosis not present

## 2017-10-20 LAB — FOLLICLE STIMULATING HORMONE: FSH: 33.7 m[IU]/mL

## 2017-10-20 LAB — ANTI MULLERIAN HORMONE: ANTI-MULLERIAN HORMONE (AMH): <0.015 ng/mL

## 2017-10-24 ENCOUNTER — Telehealth: Payer: Self-pay | Admitting: Certified Nurse Midwife

## 2017-10-24 NOTE — Telephone Encounter (Signed)
Spoke with patient. Results given. Patient verbalizes understanding. Would like to continue OCP, okay with reducing dosage.  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 FSH shows perimenopausal range. She may or may not have further bleeding. If we continued her OCP would need to decrease dosage. We discussed risks/benefits of continued use. Please advise  Leota SauersDeborah Leonard CNM please advise on new OCP.

## 2017-10-24 NOTE — Telephone Encounter (Signed)
Patient is calling for her recent lab results. °

## 2017-10-25 MED ORDER — NORETHIN-ETH ESTRAD-FE BIPHAS 1 MG-10 MCG / 10 MCG PO TABS: 1.0000 | ORAL_TABLET | Freq: Every day | ORAL | 1 refills | Status: DC

## 2017-10-25 NOTE — Telephone Encounter (Signed)
Patient called and left a message on our voice mail requesting a call back about this.

## 2017-10-25 NOTE — Telephone Encounter (Signed)
Ok to do Lo loestrin and would like her to use reduced dosage and follow up in 6 months to evaluate if needed.

## 2017-10-25 NOTE — Telephone Encounter (Signed)
Spoke with patient. Advised of message as seen below from PepsiCoDeborah Leonard CNM. Patient is agreeable. Rx for Lo Loestrin #3 1RF sent to pharmacy on file. Patient will follow up in 6 months.

## 2017-10-26 ENCOUNTER — Telehealth: Payer: Self-pay | Admitting: Certified Nurse Midwife

## 2017-10-26 DIAGNOSIS — Z30018 Encounter for initial prescription of other contraceptives: Secondary | ICD-10-CM

## 2017-10-26 DIAGNOSIS — N951 Menopausal and female climacteric states: Secondary | ICD-10-CM

## 2017-10-26 NOTE — Telephone Encounter (Signed)
Will need to submit appeal if she is going to stay on contraception, needs to be this low dose

## 2017-10-26 NOTE — Telephone Encounter (Signed)
PA submitted as urgent via cover my meds.

## 2017-10-26 NOTE — Telephone Encounter (Signed)
Insurance denied patient's prescription for lo loestrin. Would like to start it by Sunday 10/28/17.

## 2017-10-26 NOTE — Telephone Encounter (Signed)
Spoke with patient. Patient states she was notified by [employer] that LoLoestrin PA has been denied. Patient states 2 alternative medications must have been tried and failed first.   Advised patient office has been notified of PA denial for Lo Loestrin, [facility] and return call. Patient agreeable.

## 2017-10-26 NOTE — Telephone Encounter (Signed)
Patient's insurance company, [employer], called to let our office know this patient's Lo Loestrin FE has been denied.

## 2017-10-26 NOTE — Telephone Encounter (Signed)
Spoke with patient. Advised as seen below per Leota Sauerseborah Leonard, CNM. Patient request return call to review results and further clarification with Leota Sauerseborah Leonard, CNM prior to making a decision. Patients states her main concern at this point is pregnancy and her risk for pregnancy. Advised will hold appeal until further discussed with Leota Sauerseborah Leonard, CNM. Patient thankful and verbalizes understanding.   Leota Sauerseborah Leonard, CNM -can you return call to patient on 3/19?

## 2017-10-26 NOTE — Telephone Encounter (Signed)
[facility] for BlueLinxLoLoestrin. Only one alternative medication on formulary has been tried and failed. May try alternative medication on formulary or submit appeal.  Call returned to patient, advised as seen above. Advised patient will review with Leota Sauerseborah Leonard, CNM to advise on alternative medication if appropriate or submit appeal, will return call with recommendations. Patient thankful for return call and is agreeable to plan.   Leota Sauerseborah Leonard, CNM -please advise?

## 2017-10-26 NOTE — Telephone Encounter (Signed)
Spoke with patient. Advised PA has been submitted. Awaiting response from insurance company.

## 2017-10-30 MED ORDER — NORETHINDRONE 0.35 MG PO TABS: 1.0000 | ORAL_TABLET | Freq: Every day | ORAL | 11 refills | Status: DC

## 2017-10-30 NOTE — Telephone Encounter (Signed)
Phone to call patient to discuss lab results and perimenopausal and contraception use. Patient has history of menstrual migraines and OCP had helped, but patient has not had problems with in last year and only occasionally used Vivelle dot once weekly during menses. Patient has been off OCP since last visit and had period after leaving here. Did not start back on OCP and is due for period now. Discussed risks/benefits of POP and does provide contraception. Patient would like to try POP, so worried about pregnancy. Never desired this. Risks/benefits/warning signs and instructions given. Patient verbalized understanding and Rx will be sent to pharmacy to start with next menses. Will call if no menses or problems with use.

## 2017-10-30 NOTE — Telephone Encounter (Signed)
See phone note

## 2017-11-07 ENCOUNTER — Encounter: Payer: Self-pay | Admitting: Certified Nurse Midwife

## 2017-11-07 ENCOUNTER — Telehealth: Payer: Self-pay | Admitting: Certified Nurse Midwife

## 2017-11-07 NOTE — Telephone Encounter (Signed)
Spoke with patient, advised as seen below per Leota Sauerseborah Leonard, CNM. Patient verbalizes understanding and is agreeable, will close encounter.

## 2017-11-07 NOTE — Telephone Encounter (Signed)
Routing to Leota Sauerseborah Leonard, CNM, please review and advise.

## 2017-11-07 NOTE — Telephone Encounter (Signed)
Patient sent the following correspondence through MyChart. Routing to triage to assist patient with request.  ----- Message from Mychart, Generic sent at 11/07/2017 10:50 AM EDT -----    Mrs. Vanessa Mckee: I am uncertain if I am supposed to take the prescribed birth control pills. I normally start my period on Tuesday, it's Wednesday, still nothing -- having cramps like I thought I was but not yet. I did take a pregnancy test this morning (Wednesday) which was negative.    So my question is -- do I start the new birth control pills even if I don't have a period this week and if so, when?    I having the lovely symptoms that are expected -- some hot flashes and not sleeping well -- joy joy.     Thank you for your help during this transition of my life.    Again just do not want to chance getting pregnant during this time of my life either --     [name]

## 2017-11-07 NOTE — Telephone Encounter (Signed)
Patient should go ahead and start POP as discussed, with negative UPT and AMH results done.

## 2018-07-25 DIAGNOSIS — J45909 Unspecified asthma, uncomplicated: Secondary | ICD-10-CM | POA: Diagnosis not present

## 2018-07-25 DIAGNOSIS — R05 Cough: Secondary | ICD-10-CM | POA: Diagnosis not present

## 2018-08-16 ENCOUNTER — Other Ambulatory Visit: Payer: Self-pay | Admitting: Certified Nurse Midwife

## 2018-08-16 DIAGNOSIS — Z1231 Encounter for screening mammogram for malignant neoplasm of breast: Secondary | ICD-10-CM

## 2018-09-02 ENCOUNTER — Other Ambulatory Visit: Payer: Self-pay | Admitting: Certified Nurse Midwife

## 2018-09-02 DIAGNOSIS — N951 Menopausal and female climacteric states: Secondary | ICD-10-CM

## 2018-09-02 DIAGNOSIS — Z30018 Encounter for initial prescription of other contraceptives: Secondary | ICD-10-CM

## 2018-09-03 NOTE — Telephone Encounter (Signed)
Medication refill request: Micronor  Last AEX:  09/27/17 Next AEX: 10/15/18 Last MMG (if hormonal medication request): 09/06/17  Density C / Bi-rads 1 neg. Patient has Mammogram  Scheduled for 09/16/18  Refill authorized: #[id number] with 0 rf

## 2018-09-12 ENCOUNTER — Telehealth: Payer: Self-pay | Admitting: Certified Nurse Midwife

## 2018-09-12 NOTE — Telephone Encounter (Signed)
Spoke with patient, advised as seen below per Leota Sauers, CNM. Patient states she will plan to stop POP 2-3 wks prior to appt so she can repeat labs and provide update for Leota Sauers, CNM. Reviewed BUM if any concerns.   Routing to provider for final review. Patient is agreeable to disposition. Will close encounter.

## 2018-09-12 NOTE — Telephone Encounter (Signed)
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 She is on POP, she may have bleeding or not if she discontinues, but FSH was showing low menopause range.

## 2018-09-12 NOTE — Telephone Encounter (Signed)
Patient sent the following correspondence through MyChart. Routing to triage to assist patient with request.  Ms. Vanessa Mckee -- my annual appt is coming up March 3. I am still taking the birth control that prescribed me at my last appt. with no estrogen. I have not had a period (and no spotting at all) since I came off of the regular birth control pills last year. I assume I am "safe" to come off all form of birth control now. I would love to be able to have my next full blood panel done not taking any birth control but wanted your opinion/thoughts on this.     My only thing that has really really changed is my sleep -- and flashes of course.     Thank you for your time.     Vanessa Mckee

## 2018-09-12 NOTE — Telephone Encounter (Signed)
Routing to Deborah Leonard, CNM to review and advise.  

## 2018-09-16 ENCOUNTER — Ambulatory Visit
Admission: RE | Admit: 2018-09-16 | Discharge: 2018-09-16 | Disposition: A | Discharge status: Home / Self Care | Payer: BLUE CROSS/BLUE SHIELD | Source: Ambulatory Visit | Attending: Certified Nurse Midwife | Admitting: Certified Nurse Midwife

## 2018-09-16 DIAGNOSIS — Z1231 Encounter for screening mammogram for malignant neoplasm of breast: Secondary | ICD-10-CM

## 2018-09-27 DIAGNOSIS — R197 Diarrhea, unspecified: Secondary | ICD-10-CM | POA: Diagnosis not present

## 2018-09-27 DIAGNOSIS — R112 Nausea with vomiting, unspecified: Secondary | ICD-10-CM | POA: Diagnosis not present

## 2018-09-27 DIAGNOSIS — R6889 Other general symptoms and signs: Secondary | ICD-10-CM | POA: Diagnosis not present

## 2018-10-15 ENCOUNTER — Ambulatory Visit: Payer: BLUE CROSS/BLUE SHIELD | Admitting: Certified Nurse Midwife

## 2018-10-15 ENCOUNTER — Other Ambulatory Visit: Payer: Self-pay

## 2018-10-15 ENCOUNTER — Encounter: Payer: Self-pay | Admitting: Certified Nurse Midwife

## 2018-10-15 VITALS — BP 102/68 | HR 64 | Resp 16 | Ht 61.75 in | Wt 118.0 lb

## 2018-10-15 DIAGNOSIS — N912 Amenorrhea, unspecified: Secondary | ICD-10-CM

## 2018-10-15 DIAGNOSIS — Z01419 Encounter for gynecological examination (general) (routine) without abnormal findings: Secondary | ICD-10-CM

## 2018-10-15 DIAGNOSIS — E559 Vitamin D deficiency, unspecified: Secondary | ICD-10-CM

## 2018-10-15 DIAGNOSIS — Z0000 Encounter for general adult medical examination without abnormal findings: Secondary | ICD-10-CM

## 2018-10-15 DIAGNOSIS — N951 Menopausal and female climacteric states: Secondary | ICD-10-CM

## 2018-10-15 NOTE — Patient Instructions (Signed)
EXERCISE AND DIET:  We recommended that you start or continue a regular exercise program for good health. Regular exercise means any activity that makes your heart beat faster and makes you sweat.  We recommend exercising at least 30 minutes per day at least 3 days a week, preferably 4 or 5.  We also recommend a diet low in fat and sugar.  Inactivity, poor dietary choices and obesity can cause diabetes, heart attack, stroke, and kidney damage, among others.    ALCOHOL AND SMOKING:  Women should limit their alcohol intake to no more than 7 drinks/beers/glasses of wine (combined, not each!) per week. Moderation of alcohol intake to this level decreases your risk of breast cancer and liver damage. And of course, no recreational drugs are part of a healthy lifestyle.  And absolutely no smoking or even second hand smoke. Most people know smoking can cause heart and lung diseases, but did you know it also contributes to weakening of your bones? Aging of your skin?  Yellowing of your teeth and nails?  CALCIUM AND VITAMIN D:  Adequate intake of calcium and Vitamin D are recommended.  The recommendations for exact amounts of these supplements seem to change often, but generally speaking 600 mg of calcium (either carbonate or citrate) and 800 units of Vitamin D per day seems prudent. Certain women may benefit from higher intake of Vitamin D.  If you are among these women, your doctor will have told you during your visit.    PAP SMEARS:  Pap smears, to check for cervical cancer or precancers,  have traditionally been done yearly, although recent scientific advances have shown that most women can have pap smears less often.  However, every woman still should have a physical exam from her gynecologist every year. It will include a breast check, inspection of the vulva and vagina to check for abnormal growths or skin changes, a visual exam of the cervix, and then an exam to evaluate the size and shape of the uterus and  ovaries.  And after 53 years of age, a rectal exam is indicated to check for rectal cancers. We will also provide age appropriate advice regarding health maintenance, like when you should have certain vaccines, screening for sexually transmitted diseases, bone density testing, colonoscopy, mammograms, etc.   MAMMOGRAMS:  All women over 55 years old should have a yearly mammogram. Many facilities now offer a "3D" mammogram, which may cost around $50 extra out of pocket. If possible,  we recommend you accept the option to have the 3D mammogram performed.  It both reduces the number of women who will be called back for extra views which then turn out to be normal, and it is better than the routine mammogram at detecting truly abnormal areas.    COLONOSCOPY:  Colonoscopy to screen for colon cancer is recommended for all women at age 12.  We know, you hate the idea of the prep.  We agree, BUT, having colon cancer and not knowing it is worse!!  Colon cancer so often starts as a polyp that can be seen and removed at colonscopy, which can quite literally save your life!  And if your first colonoscopy is normal and you have no family history of colon cancer, most women don't have to have it again for 10 years.  Once every ten years, you can do something that may end up saving your life, right?  We will be happy to help you get it scheduled when you are ready.  Be sure to check your insurance coverage so you understand how much it will cost.  It may be covered as a preventative service at no cost, but you should check your particular policy.      Menopause Menopause is the normal time of life when menstrual periods stop completely. It is usually confirmed by 12 months without a menstrual period. The transition to menopause (perimenopause) most often happens between the ages of 78 and 35. During perimenopause, hormone levels change in your body, which can cause symptoms and affect your health. Menopause may increase  your risk for:  Loss of bone (osteoporosis), which causes bone breaks (fractures).  Depression.  Hardening and narrowing of the arteries (atherosclerosis), which can cause heart attacks and strokes. What are the causes? This condition is usually caused by a natural change in hormone levels that happens as you get older. The condition may also be caused by surgery to remove both ovaries (bilateral oophorectomy). What increases the risk? This condition is more likely to start at an earlier age if you have certain medical conditions or treatments, including:  A tumor of the pituitary gland in the brain.  A disease that affects the ovaries and hormone production.  Radiation treatment for cancer.  Certain cancer treatments, such as chemotherapy or hormone (anti-estrogen) therapy.  Heavy smoking and excessive alcohol use.  Family history of early menopause. This condition is also more likely to develop earlier in women who are very thin. What are the signs or symptoms? Symptoms of this condition include:  Hot flashes.  Irregular menstrual periods.  Night sweats.  Changes in feelings about sex. This could be a decrease in sex drive or an increased comfort around your sexuality.  Vaginal dryness and thinning of the vaginal walls. This may cause painful intercourse.  Dryness of the skin and development of wrinkles.  Headaches.  Problems sleeping (insomnia).  Mood swings or irritability.  Memory problems.  Weight gain.  Hair growth on the face and chest.  Bladder infections or problems with urinating. How is this diagnosed? This condition is diagnosed based on your medical history, a physical exam, your age, your menstrual history, and your symptoms. Hormone tests may also be done. How is this treated? In some cases, no treatment is needed. You and your health care provider should make a decision together about whether treatment is necessary. Treatment will be based on  your individual condition and preferences. Treatment for this condition focuses on managing symptoms. Treatment may include:  Menopausal hormone therapy (MHT).  Medicines to treat specific symptoms or complications.  Acupuncture.  Vitamin or herbal supplements. Before starting treatment, make sure to let your health care provider know if you have a personal or family history of:  Heart disease.  Breast cancer.  Blood clots.  Diabetes.  Osteoporosis. Follow these instructions at home: Lifestyle  Do not use any products that contain nicotine or tobacco, such as cigarettes and e-cigarettes. If you need help quitting, ask your health care provider.  Get at least 30 minutes of physical activity on 5 or more days each week.  Avoid alcoholic and caffeinated beverages, as well as spicy foods. This may help prevent hot flashes.  Get 7-8 hours of sleep each night.  If you have hot flashes, try: ? Dressing in layers. ? Avoiding things that may trigger hot flashes, such as spicy food, warm places, or stress. ? Taking slow, deep breaths when a hot flash starts. ? Keeping a fan in your home and  office.  Find ways to manage stress, such as deep breathing, meditation, or journaling.  Consider going to group therapy with other women who are having menopause symptoms. Ask your health care provider about recommended group therapy meetings. Eating and drinking  Eat a healthy, balanced diet that contains whole grains, lean protein, low-fat dairy, and plenty of fruits and vegetables.  Your health care provider may recommend adding more soy to your diet. Foods that contain soy include tofu, tempeh, and soy milk.  Eat plenty of foods that contain calcium and vitamin D for bone health. Items that are rich in calcium include low-fat milk, yogurt, beans, almonds, sardines, broccoli, and kale. Medicines  Take over-the-counter and prescription medicines only as told by your health care  provider.  Talk with your health care provider before starting any herbal supplements. If prescribed, take vitamins and supplements as told by your health care provider. These may include: ? Calcium. Women age 65 and older should get 1,200 mg (milligrams) of calcium every day. ? Vitamin D. Women need 600-800 International Units of vitamin D each day. ? Vitamins B12 and B6. Aim for 50 micrograms of B12 and 1.5 mg of B6 each day. General instructions  Keep track of your menstrual periods, including: ? When they occur. ? How heavy they are and how long they last. ? How much time passes between periods.  Keep track of your symptoms, noting when they start, how often you have them, and how long they last.  Use vaginal lubricants or moisturizers to help with vaginal dryness and improve comfort during sex.  Keep all follow-up visits as told by your health care provider. This is important. This includes any group therapy or counseling. Contact a health care provider if:  You are still having menstrual periods after age 49.  You have pain during sex.  You have not had a period for 12 months and you develop vaginal bleeding. Get help right away if:  You have: ? Severe depression. ? Excessive vaginal bleeding. ? Pain when you urinate. ? A fast or irregular heart beat (palpitations). ? Severe headaches. ? Abdomen (abdominal) pain or severe indigestion.  You fell and you think you have a broken bone.  You develop leg or chest pain.  You develop vision problems.  You feel a lump in your breast. Summary  Menopause is the normal time of life when menstrual periods stop completely. It is usually confirmed by 12 months without a menstrual period.  The transition to menopause (perimenopause) most often happens between the ages of 73 and 50.  Symptoms can be managed through medicines, lifestyle changes, and complementary therapies such as acupuncture.  Eat a balanced diet that is rich  in nutrients to promote bone health and heart health and to manage symptoms during menopause. This information is not intended to replace advice given to you by your health care provider. Make sure you discuss any questions you have with your health care provider. Document Released: 10/21/2003 Document Revised: 09/02/2016 Document Reviewed: 09/02/2016 Elsevier Interactive Patient Education  2019 [employer].

## 2018-10-15 NOTE — Progress Notes (Signed)
53 y.o. G0P0000 Married  Caucasian Fe here for annual exam. Menopausal no vaginal bleeding since stopping the OCP. Occasional hot flash and night sweats. Sleep issues now with urinary urgency. Drinking water frequently, which is normal. Some vaginal dryness and using coconut oil with some change. Feels she is not using frequent enough. Fasted for labs. No other health issues today.  No LMP recorded (exact date). Patient is perimenopausal.          Sexually active: Yes.    The current method of family planning is none.    Exercising: Yes.    weights & aerobic Smoker:  no  Review of Systems  Constitutional: Negative.   HENT: Negative.   Eyes: Negative.   Respiratory: Negative.   Cardiovascular: Negative.   Gastrointestinal: Negative.   Genitourinary: Negative.        Painful with intercourse & trouble sleeping  Musculoskeletal: Negative.   Skin: Negative.   Neurological: Negative.   Endo/Heme/Allergies: Negative.   Psychiatric/Behavioral: Negative.     Health Maintenance: Pap:  08-26-14 neg HPV HR neg, 09-27-17 ASCUS HPV HR neg History of Abnormal Pap: yes MMG:  09-16-2018 category b density birads 1:neg Self Breast exams: yes Colonoscopy:  2017 neg f/u 15yrs BMD:   none TDaP:  2019 Shingles: no Pneumonia: no Hep C and HIV: HIV neg 2017 Labs: yes   reports that she has never smoked. She has never used smokeless tobacco. She reports that she does not drink alcohol or use drugs.  Past Medical History:  Diagnosis Date  . Fibrocystic breast disease (FCBD)   . Hyperplastic colon polyp 06/30/2016  . Menstrual migraine   . 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 Pap smear abnormality of cervix with HGSIL 1/92 - 2/92   persistent CIN II treated with Efudex  . Pap smear abnormality of cervix with HGSIL 04/2007   LEEP for recurent CIN III    Past Surgical History:  Procedure Laterality Date  . CERVICAL BIOPSY  W/ LOOP ELECTRODE EXCISION   9/08   recurrent CIN-III  . COLPOSCOPY    . FOOT NEUROMA SURGERY Left     Current Outpatient Medications  Medication Sig Dispense Refill  . triamcinolone cream (KENALOG) 0.1 % as directed.  2   No current facility-administered medications for this visit.     Family History  Problem Relation Age of Onset  . Hypertrophic cardiomyopathy Mother   . Hypertension Mother   . Hypertension Father   . Heart disease Father   . Hypertrophic cardiomyopathy Maternal Grandfather        unsure if diagnosed  . Heart failure Paternal Grandmother   . Cancer Paternal Grandfather 7       renal or stomach  . Hypertrophic cardiomyopathy Maternal Aunt     ROS:  Pertinent items are noted in HPI.  Otherwise, a comprehensive ROS was negative.  Exam:   BP 102/68   Pulse 64   Resp 16   Ht 5' 1.75" (1.568 m)   Wt 118 lb (53.5 kg)   LMP  (Exact Date) Comment: 09/2017  BMI 21.76 kg/m  Height: 5' 1.75" (156.8 cm) Ht Readings from Last 3 Encounters:  10/15/18 5' 1.75" (1.568 m)  09/27/17 5' 1.25" (1.556 m)  09/18/16 5' 1.75" (1.568 m)    General appearance: alert, cooperative and appears stated age Head: Normocephalic, without obvious abnormality, atraumatic Neck: no adenopathy, supple, symmetrical, trachea midline and thyroid normal  to inspection and palpation Lungs: clear to auscultation bilaterally Breasts: normal appearance, no masses or tenderness, No nipple retraction or dimpling, No nipple discharge or bleeding, No axillary or supraclavicular adenopathy Heart: regular rate and rhythm Abdomen: soft, non-tender; no masses,  no organomegaly Extremities: extremities normal, atraumatic, no cyanosis or edema Skin: Skin color, texture, turgor normal. No rashes or lesions Lymph nodes: Cervical, supraclavicular, and axillary nodes normal. No abnormal inguinal nodes palpated Neurologic: Grossly normal   Pelvic: External genitalia:  no lesions              Urethra:  normal appearing urethra with  no masses, tenderness or lesions              Bartholin's and Skene's: normal                 Vagina: normal appearing vagina with normal color and discharge, no lesions              Cervix: no cervical motion tenderness and no lesions              Pap taken: No. Bimanual Exam:  Uterus:  normal size, contour, position, consistency, mobility, non-tender and anteflexed              Adnexa: normal adnexa and no mass, fullness, tenderness               Rectovaginal: Confirms               Anus:  normal sphincter tone, no lesions  Chaperone present: yes  A:  Well Woman with normal exam  Amenorrhea with stopping OCP, no vaginal bleeding   Menopausal symptoms  Insomnia at times  Screening labs  P:   Reviewed health and wellness pertinent to exam  Discussed expectations with menopausal and need to advise if vagina bleeding now.  Discussed decreasing water intake at hs, to decrease voiding frequency.  Labs: CBC,FSH, TSH, Lipid panel, CMP,   Pap smear: no   counseled on breast self exam, mammography screening, adequate intake of calcium and vitamin D, diet and exercise  return annually or prn  An After Visit Summary was printed and given to the patient.

## 2018-10-16 LAB — CBC
Hematocrit: 44.7 % (ref 34.0–46.6)
Hemoglobin: 14.6 g/dL (ref 11.1–15.9)
MCH: 28.9 pg (ref 26.6–33.0)
MCHC: 32.7 g/dL (ref 31.5–35.7)
MCV: 89 fL (ref 79–97)
Platelets: 196 10*3/uL (ref 150–450)
RBC: 5.05 x10E6/uL (ref 3.77–5.28)
RDW: 12.5 % (ref 11.7–15.4)
WBC: 5.3 10*3/uL (ref 3.4–10.8)

## 2018-10-16 LAB — LIPID PANEL
Chol/HDL Ratio: 2.7 ratio (ref 0.0–4.4)
Cholesterol, Total: 153 mg/dL (ref 100–199)
HDL: 56 mg/dL (ref >39)
LDL Calculated: 85 mg/dL (ref 0–99)
Triglycerides: 62 mg/dL (ref 0–149)
VLDL Cholesterol Cal: 12 mg/dL (ref 5–40)

## 2018-10-16 LAB — COMPREHENSIVE METABOLIC PANEL
A/G RATIO: 2.3 — AB (ref 1.2–2.2)
ALT: 30 IU/L (ref 0–32)
AST: 26 IU/L (ref 0–40)
Albumin: 4.6 g/dL (ref 3.8–4.9)
Alkaline Phosphatase: 79 IU/L (ref 39–117)
BUN/Creatinine Ratio: 22 (ref 9–23)
BUN: 17 mg/dL (ref 6–24)
Bilirubin Total: 1 mg/dL (ref 0.0–1.2)
CO2: 26 mmol/L (ref 20–29)
Calcium: 9.7 mg/dL (ref 8.7–10.2)
Chloride: 99 mmol/L (ref 96–106)
Creatinine, Ser: 0.78 mg/dL (ref 0.57–1.00)
GFR calc Af Amer: 101 mL/min/{1.73_m2} (ref >59)
GFR calc non Af Amer: 88 mL/min/{1.73_m2} (ref >59)
Globulin, Total: 2 g/dL (ref 1.5–4.5)
Glucose: 74 mg/dL (ref 65–99)
POTASSIUM: 4.1 mmol/L (ref 3.5–5.2)
Sodium: 140 mmol/L (ref 134–144)
Total Protein: 6.6 g/dL (ref 6.0–8.5)

## 2018-10-16 LAB — FOLLICLE STIMULATING HORMONE: FSH: 91.4 m[IU]/mL

## 2018-10-16 LAB — TSH: TSH: 1.73 u[IU]/mL (ref 0.450–4.500)

## 2019-03-11 DIAGNOSIS — M654 Radial styloid tenosynovitis [de Quervain]: Secondary | ICD-10-CM | POA: Diagnosis not present

## 2019-03-11 DIAGNOSIS — M25531 Pain in right wrist: Secondary | ICD-10-CM | POA: Insufficient documentation

## 2019-03-11 DIAGNOSIS — M25431 Effusion, right wrist: Secondary | ICD-10-CM | POA: Insufficient documentation

## 2019-03-11 DIAGNOSIS — G5601 Carpal tunnel syndrome, right upper limb: Secondary | ICD-10-CM | POA: Diagnosis not present

## 2019-03-12 DIAGNOSIS — G5601 Carpal tunnel syndrome, right upper limb: Secondary | ICD-10-CM | POA: Insufficient documentation

## 2019-03-12 DIAGNOSIS — M659 Synovitis and tenosynovitis, unspecified: Secondary | ICD-10-CM | POA: Insufficient documentation

## 2019-03-27 DIAGNOSIS — L821 Other seborrheic keratosis: Secondary | ICD-10-CM | POA: Diagnosis not present

## 2019-03-27 DIAGNOSIS — L720 Epidermal cyst: Secondary | ICD-10-CM | POA: Diagnosis not present

## 2019-03-27 DIAGNOSIS — D225 Melanocytic nevi of trunk: Secondary | ICD-10-CM | POA: Diagnosis not present

## 2019-03-27 DIAGNOSIS — L820 Inflamed seborrheic keratosis: Secondary | ICD-10-CM | POA: Diagnosis not present

## 2019-03-27 DIAGNOSIS — L570 Actinic keratosis: Secondary | ICD-10-CM | POA: Diagnosis not present

## 2019-05-23 DIAGNOSIS — G5601 Carpal tunnel syndrome, right upper limb: Secondary | ICD-10-CM | POA: Diagnosis not present

## 2019-05-23 DIAGNOSIS — M654 Radial styloid tenosynovitis [de Quervain]: Secondary | ICD-10-CM | POA: Diagnosis not present

## 2019-06-23 DIAGNOSIS — H10413 Chronic giant papillary conjunctivitis, bilateral: Secondary | ICD-10-CM | POA: Diagnosis not present

## 2019-07-31 DIAGNOSIS — L720 Epidermal cyst: Secondary | ICD-10-CM | POA: Diagnosis not present

## 2019-08-05 DIAGNOSIS — M25562 Pain in left knee: Secondary | ICD-10-CM | POA: Diagnosis not present

## 2019-08-13 ENCOUNTER — Other Ambulatory Visit: Payer: Self-pay | Admitting: Certified Nurse Midwife

## 2019-08-13 DIAGNOSIS — Z1231 Encounter for screening mammogram for malignant neoplasm of breast: Secondary | ICD-10-CM

## 2019-09-22 ENCOUNTER — Other Ambulatory Visit: Payer: Self-pay

## 2019-09-22 ENCOUNTER — Ambulatory Visit
Admission: RE | Admit: 2019-09-22 | Discharge: 2019-09-22 | Disposition: A | Discharge status: Home / Self Care | Payer: BC Managed Care – PPO | Source: Ambulatory Visit | Attending: Certified Nurse Midwife | Admitting: Certified Nurse Midwife

## 2019-09-22 DIAGNOSIS — Z1231 Encounter for screening mammogram for malignant neoplasm of breast: Secondary | ICD-10-CM | POA: Diagnosis not present

## 2019-09-25 NOTE — Progress Notes (Signed)
Mammogram is negative  Density is C Repeat mammogram yearly

## 2019-10-16 ENCOUNTER — Ambulatory Visit: Payer: BLUE CROSS/BLUE SHIELD | Admitting: Certified Nurse Midwife

## 2019-10-17 ENCOUNTER — Other Ambulatory Visit: Payer: Self-pay

## 2019-10-20 ENCOUNTER — Encounter: Payer: Self-pay | Admitting: Certified Nurse Midwife

## 2019-10-20 ENCOUNTER — Ambulatory Visit: Payer: BC Managed Care – PPO | Admitting: Certified Nurse Midwife

## 2019-10-20 ENCOUNTER — Other Ambulatory Visit: Payer: Self-pay

## 2019-10-20 ENCOUNTER — Other Ambulatory Visit (HOSPITAL_COMMUNITY)
Admission: RE | Admit: 2019-10-20 | Discharge: 2019-10-20 | Disposition: A | Discharge status: Home / Self Care | Payer: BC Managed Care – PPO | Source: Ambulatory Visit | Attending: Certified Nurse Midwife | Admitting: Certified Nurse Midwife

## 2019-10-20 VITALS — BP 114/76 | HR 68 | Temp 97.2°F | Resp 16 | Ht 62.25 in | Wt 121.0 lb

## 2019-10-20 DIAGNOSIS — Z01419 Encounter for gynecological examination (general) (routine) without abnormal findings: Secondary | ICD-10-CM

## 2019-10-20 DIAGNOSIS — E559 Vitamin D deficiency, unspecified: Secondary | ICD-10-CM

## 2019-10-20 DIAGNOSIS — Z124 Encounter for screening for malignant neoplasm of cervix: Secondary | ICD-10-CM | POA: Diagnosis not present

## 2019-10-20 DIAGNOSIS — Z0000 Encounter for general adult medical examination without abnormal findings: Secondary | ICD-10-CM | POA: Diagnosis not present

## 2019-10-20 NOTE — Patient Instructions (Signed)

## 2019-10-20 NOTE — Progress Notes (Signed)
54 y.o. G0P0000 Married  Caucasian Fe here for annual exam. Post menopausal no HRT. Denies vaginal bleeding occasional vaginal dryness using coconut oil with good response. Feels she may have had Covid and was seen by PCP. She was in last year right after this occurrence. Considering Covid Vaccine.Has been able to continue working and using Gym. Sees Urgent care if needed. Screening labs today Continues to have occasional hot flashes and has noted some joint pain, but works out routinely. Saw Orthopedic for joint issues. No other health issues today.  Patient's last menstrual period was 09/11/2017 (exact date).          Sexually active: Yes.    The current method of family planning is post menopausal status.    Exercising: Yes.    weights, cardio, kickboxing, yoga Smoker:  no  Review of Systems  Constitutional: Negative.   HENT: Negative.   Eyes: Negative.   Respiratory: Negative.   Cardiovascular: Negative.   Gastrointestinal: Negative.   Genitourinary: Negative.   Musculoskeletal: Negative.   Skin: Negative.   Neurological: Negative.   Endo/Heme/Allergies: Negative.   Psychiatric/Behavioral: Negative.     Health Maintenance: Pap:  09-27-2017 ASCUS HPV HR neg History of Abnormal Pap: yes MMG:  09-25-2019 category c density birads 1:neg Self Breast exams: yes Colonoscopy:  2017 f/u 42yrs BMD:   none TDaP:  2019 Shingles: no Pneumonia: no Hep C and HIV: HIV neg 2017 Labs: yes   reports that she has never smoked. She has never used smokeless tobacco. She reports that she does not drink alcohol or use drugs.  Past Medical History:  Diagnosis Date  . Fibrocystic breast disease (FCBD)   . Hyperplastic colon polyp 06/30/2016  . Menstrual migraine   . 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 Pap smear abnormality of cervix with HGSIL 1/92 - 2/92   persistent CIN II treated with Efudex  . Pap smear abnormality of cervix with HGSIL 04/2007    LEEP for recurent CIN III    Past Surgical History:  Procedure Laterality Date  . CERVICAL BIOPSY  W/ LOOP ELECTRODE EXCISION  9/08   recurrent CIN-III  . COLPOSCOPY    . FOOT NEUROMA SURGERY Left     No current outpatient medications on file.   No current facility-administered medications for this visit.    Family History  Problem Relation Age of Onset  . Hypertrophic cardiomyopathy Mother   . Hypertension Mother   . Hypertension Father   . Heart disease Father   . Hypertrophic cardiomyopathy Maternal Grandfather        unsure if diagnosed  . Heart failure Paternal Grandmother   . Cancer Paternal Grandfather 45       renal or stomach  . Hypertrophic cardiomyopathy Maternal Aunt     ROS:  Pertinent items are noted in HPI.  Otherwise, a comprehensive ROS was negative.  Exam:   BP 114/76   Pulse 68   Temp (!) 97.2 F (36.2 C) (Skin)   Resp 16   LMP 09/11/2017 (Exact Date)    Ht Readings from Last 3 Encounters:  10/15/18 5' 1.75" (1.568 m)  09/27/17 5' 1.25" (1.556 m)  09/18/16 5' 1.75" (1.568 m)    General appearance: alert, cooperative and appears stated age Head: Normocephalic, without obvious abnormality, atraumatic Neck: no adenopathy, supple, symmetrical, trachea midline and thyroid normal to inspection and palpation Lungs: clear to auscultation bilaterally Breasts: normal appearance, no  masses or tenderness, No nipple retraction or dimpling, No nipple discharge or bleeding, No axillary or supraclavicular adenopathy Heart: regular rate and rhythm Abdomen: soft, non-tender; no masses,  no organomegaly Extremities: extremities normal, atraumatic, no cyanosis or edema Skin: Skin color, texture, turgor normal. No rashes or lesions Lymph nodes: Cervical, supraclavicular, and axillary nodes normal. No abnormal inguinal nodes palpated Neurologic: Grossly normal   Pelvic: External genitalia:  no lesions              Urethra:  normal appearing urethra with no  masses, tenderness or lesions              Bartholin's and Skene's: normal                 Vagina: normal appearing vagina with normal color and discharge, no lesions              Cervix: no cervical motion tenderness and no lesions LEEP appearance, flat from laser vaporization              Pap taken: Yes.   Bimanual Exam:  Uterus:  normal size, contour, position, consistency, mobility, non-tender and anteverted              Adnexa: normal adnexa and no mass, fullness, tenderness               Rectovaginal: Confirms               Anus:  normal sphincter tone, no lesions  Chaperone present: yes  A:  Well Woman with normal exam  Menopausal no HRT  History of CIN 3 with Leep and Laser vaporization  Screening labs  P:   Reviewed health and wellness pertinent to exam  Aware of need to advise if vaginal bleeding  Discussed need for gyn exam routinely due to history of abnormal pap  Lab:CBC,CMP,Lipid panel, Vitamin D, TSH  Pap smear: yes   counseled on breast self exam, mammography screening, feminine hygiene, menopause, adequate intake of calcium and vitamin D, diet and exercise  return annually or prn  An After Visit Summary was printed and given to the patient.

## 2019-10-21 ENCOUNTER — Other Ambulatory Visit: Payer: Self-pay | Admitting: Certified Nurse Midwife

## 2019-10-21 DIAGNOSIS — R899 Unspecified abnormal finding in specimens from other organs, systems and tissues: Secondary | ICD-10-CM

## 2019-10-21 LAB — TSH: TSH: 2.02 u[IU]/mL (ref 0.450–4.500)

## 2019-10-21 LAB — CBC
Hematocrit: 42.2 % (ref 34.0–46.6)
Hemoglobin: 14.2 g/dL (ref 11.1–15.9)
MCH: 29.8 pg (ref 26.6–33.0)
MCHC: 33.6 g/dL (ref 31.5–35.7)
MCV: 89 fL (ref 79–97)
Platelets: 148 10*3/uL — ABNORMAL LOW (ref 150–450)
RBC: 4.77 x10E6/uL (ref 3.77–5.28)
RDW: 12.7 % (ref 11.7–15.4)
WBC: 6.8 10*3/uL (ref 3.4–10.8)

## 2019-10-21 LAB — COMPREHENSIVE METABOLIC PANEL
ALT: 27 IU/L (ref 0–32)
AST: 25 IU/L (ref 0–40)
Albumin/Globulin Ratio: 2.1 (ref 1.2–2.2)
Albumin: 4.5 g/dL (ref 3.8–4.9)
Alkaline Phosphatase: 84 IU/L (ref 39–117)
BUN/Creatinine Ratio: 23 (ref 9–23)
BUN: 16 mg/dL (ref 6–24)
Bilirubin Total: 0.4 mg/dL (ref 0.0–1.2)
CO2: 25 mmol/L (ref 20–29)
Calcium: 9.2 mg/dL (ref 8.7–10.2)
Chloride: 105 mmol/L (ref 96–106)
Creatinine, Ser: 0.71 mg/dL (ref 0.57–1.00)
GFR calc Af Amer: 112 mL/min/{1.73_m2} (ref >59)
GFR calc non Af Amer: 98 mL/min/{1.73_m2} (ref >59)
Globulin, Total: 2.1 g/dL (ref 1.5–4.5)
Glucose: 90 mg/dL (ref 65–99)
Potassium: 4.4 mmol/L (ref 3.5–5.2)
Sodium: 143 mmol/L (ref 134–144)
Total Protein: 6.6 g/dL (ref 6.0–8.5)

## 2019-10-21 LAB — VITAMIN D 25 HYDROXY (VIT D DEFICIENCY, FRACTURES): Vit D, 25-Hydroxy: 47.3 ng/mL (ref 30.0–100.0)

## 2019-10-21 LAB — LIPID PANEL
Chol/HDL Ratio: 2.8 ratio (ref 0.0–4.4)
Cholesterol, Total: 149 mg/dL (ref 100–199)
HDL: 53 mg/dL (ref >39)
LDL Chol Calc (NIH): 83 mg/dL (ref 0–99)
Triglycerides: 66 mg/dL (ref 0–149)
VLDL Cholesterol Cal: 13 mg/dL (ref 5–40)

## 2019-10-22 LAB — CYTOLOGY - PAP
Comment: NEGATIVE
Diagnosis: NEGATIVE
High risk HPV: NEGATIVE

## 2019-10-31 ENCOUNTER — Other Ambulatory Visit (INDEPENDENT_AMBULATORY_CARE_PROVIDER_SITE_OTHER): Payer: BC Managed Care – PPO

## 2019-10-31 ENCOUNTER — Other Ambulatory Visit: Payer: Self-pay

## 2019-10-31 DIAGNOSIS — R899 Unspecified abnormal finding in specimens from other organs, systems and tissues: Secondary | ICD-10-CM | POA: Diagnosis not present

## 2019-11-01 LAB — PLATELET COUNT: Platelets: 181 10*3/uL (ref 150–450)

## 2019-11-03 ENCOUNTER — Encounter: Payer: Self-pay | Admitting: Certified Nurse Midwife

## 2019-11-21 DIAGNOSIS — G5601 Carpal tunnel syndrome, right upper limb: Secondary | ICD-10-CM | POA: Diagnosis not present

## 2019-11-21 DIAGNOSIS — M654 Radial styloid tenosynovitis [de Quervain]: Secondary | ICD-10-CM | POA: Diagnosis not present

## 2020-03-26 DIAGNOSIS — L570 Actinic keratosis: Secondary | ICD-10-CM | POA: Diagnosis not present

## 2020-03-26 DIAGNOSIS — L814 Other melanin hyperpigmentation: Secondary | ICD-10-CM | POA: Diagnosis not present

## 2020-03-26 DIAGNOSIS — L438 Other lichen planus: Secondary | ICD-10-CM | POA: Diagnosis not present

## 2020-03-26 DIAGNOSIS — D225 Melanocytic nevi of trunk: Secondary | ICD-10-CM | POA: Diagnosis not present

## 2020-08-08 IMAGING — MG DIGITAL SCREENING BILATERAL MAMMOGRAM WITH TOMO AND CAD
8 series · 9 of 24 positions shown · non-contrast
Comparison: Previous exam(s).

CLINICAL DATA: Screening.

EXAM:
DIGITAL SCREENING BILATERAL MAMMOGRAM WITH TOMO AND CAD

[R CC synth-2D]
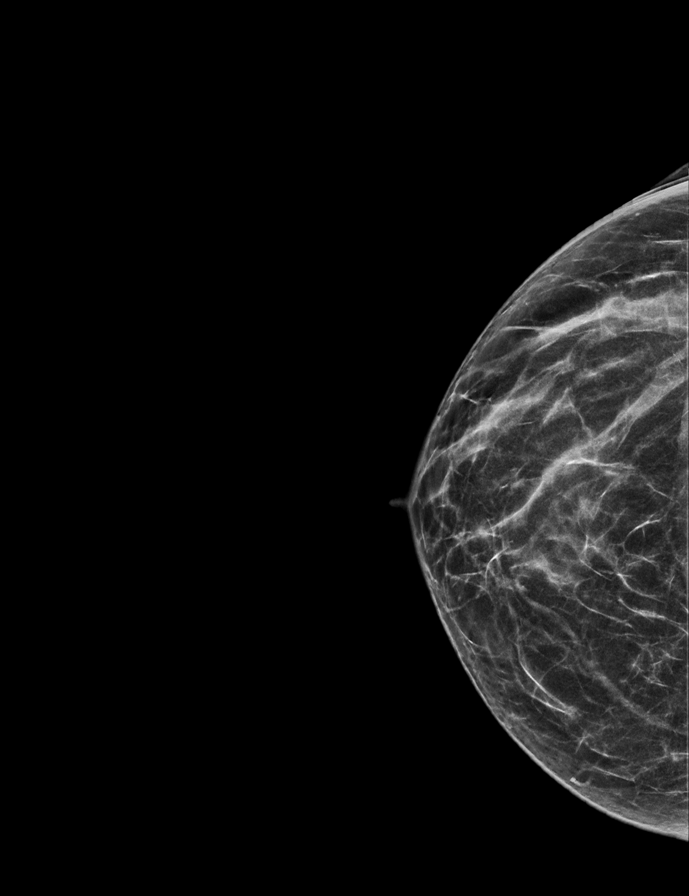

[L CC synth-2D]
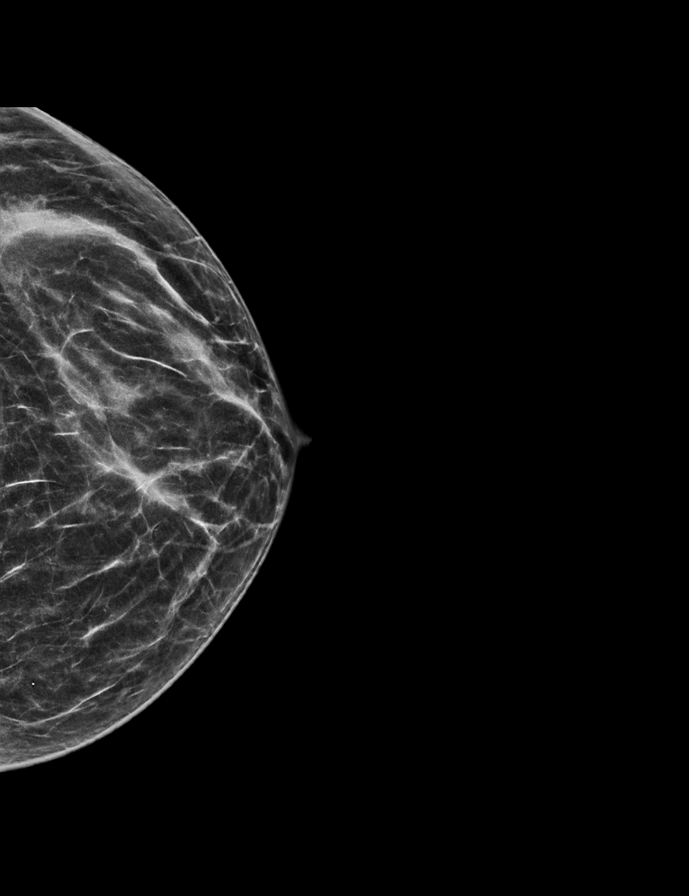

[L MLO synth-2D]
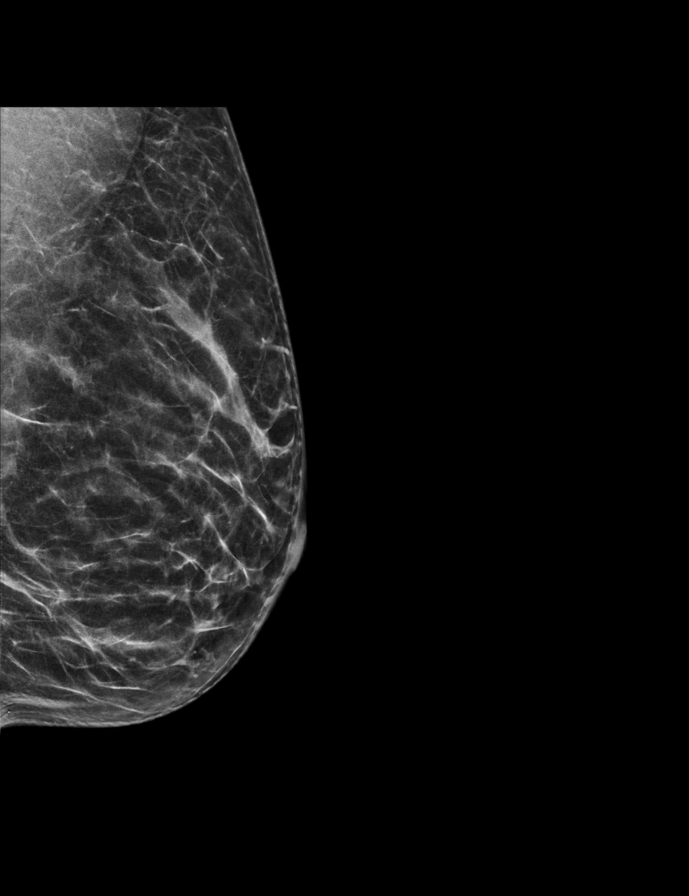

[R MLO synth-2D]
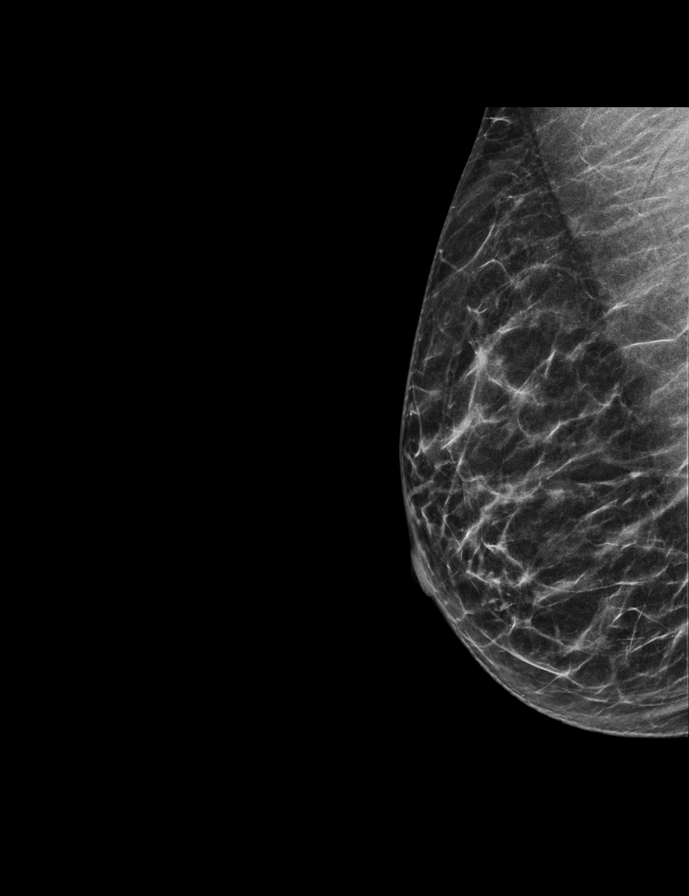

[L MLO tomo · 2 of 49 frames shown]
[frame 16/49]
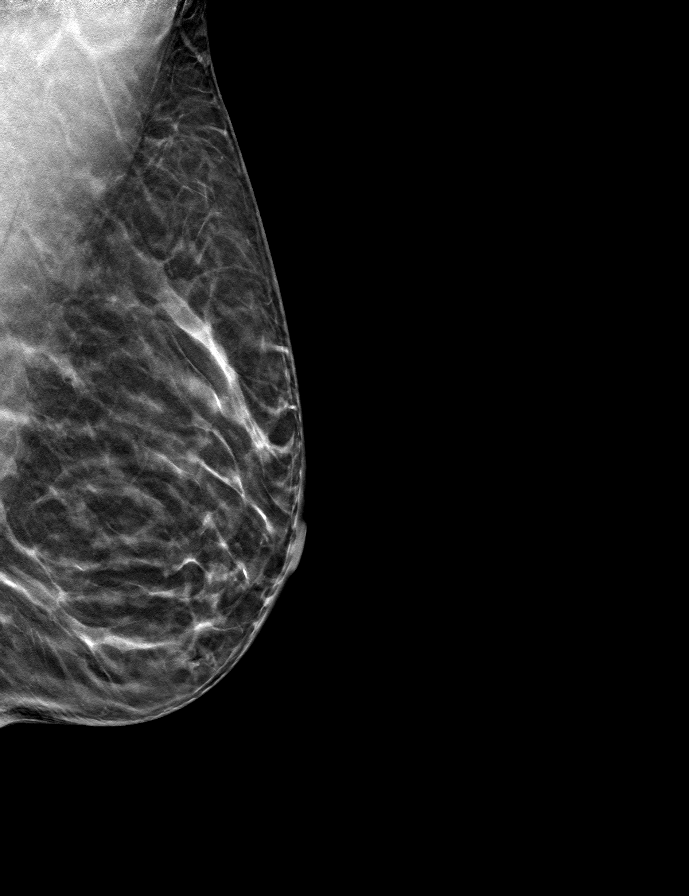
[frame 25/49]
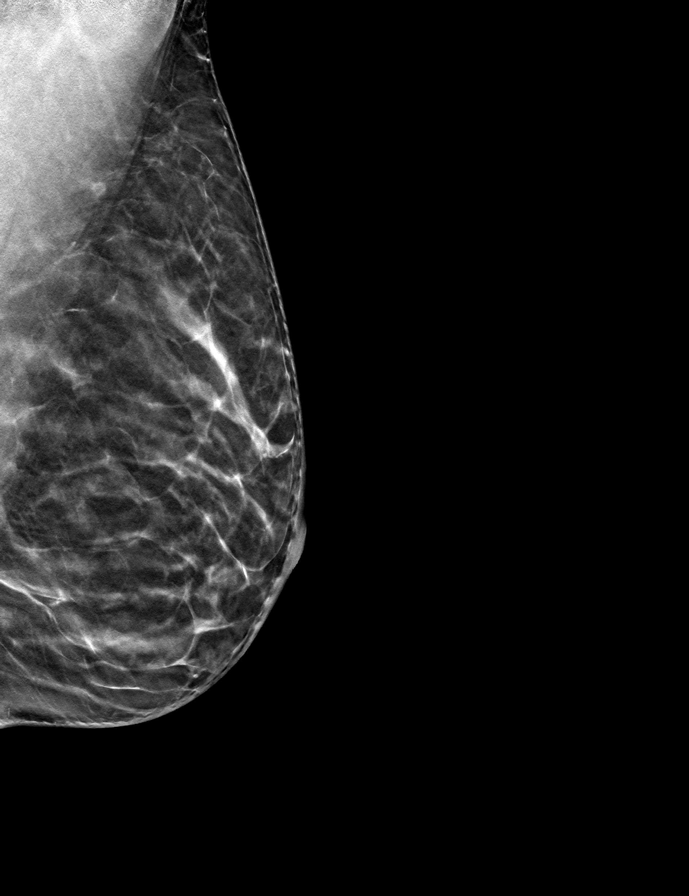

[R MLO tomo · tomo slice 26/51.0]
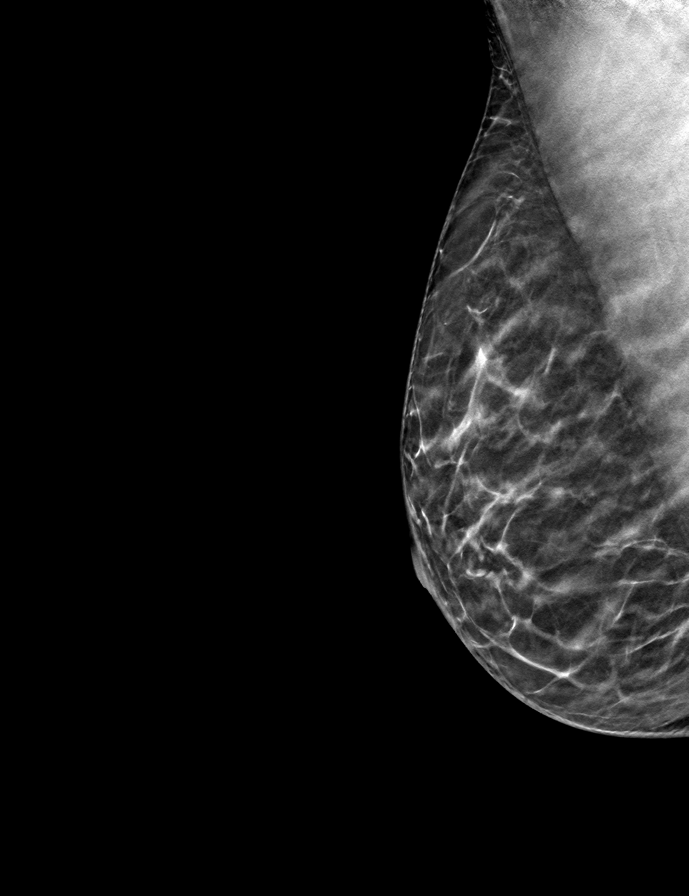

[L CC tomo · tomo slice 25/49.0]
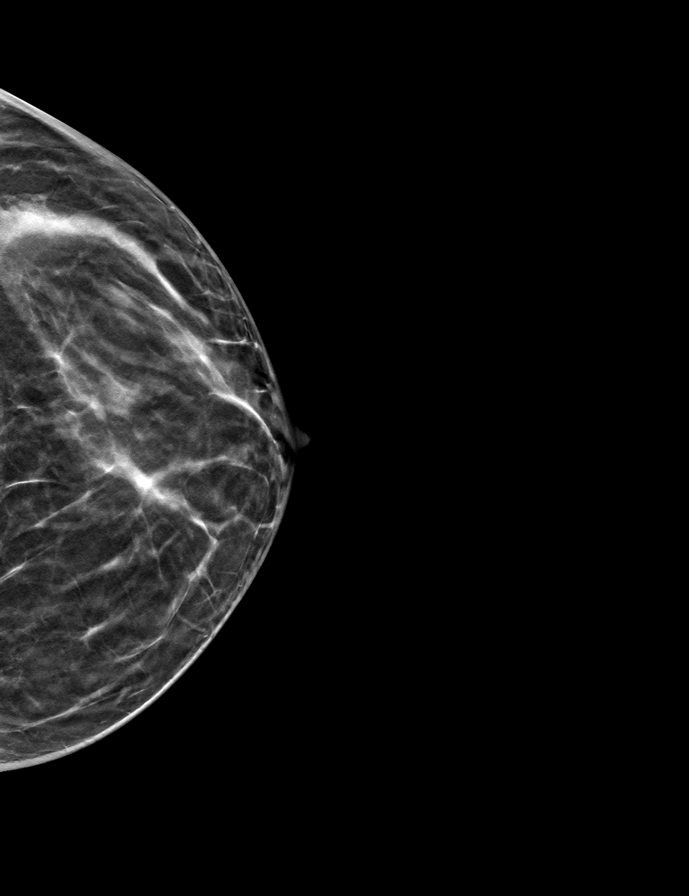

[R CC tomo · tomo slice 27/52.0]
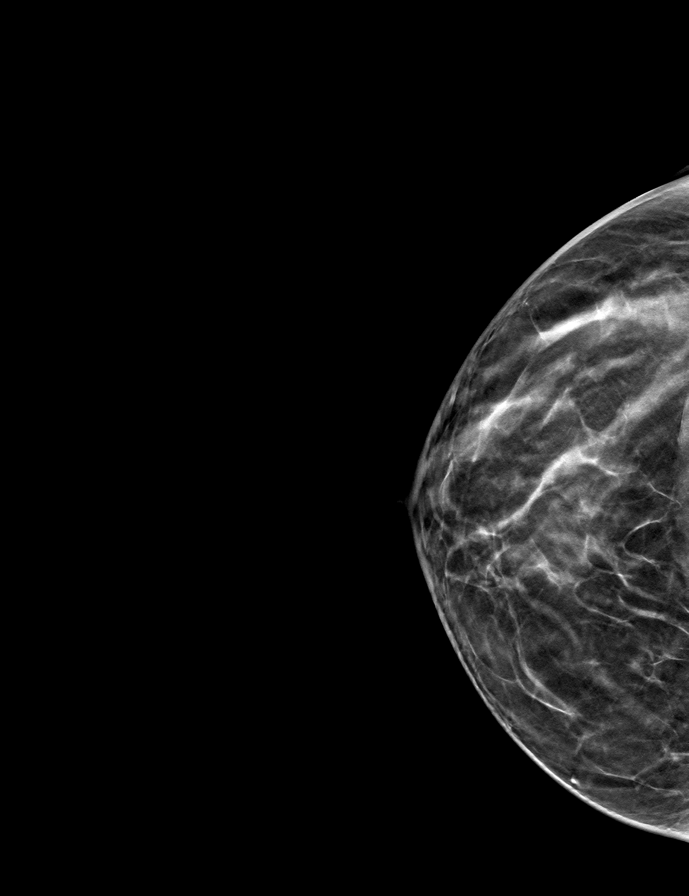

[9 of 24 positions shown; findings below may reference images not displayed]

ACR Breast Density Category b: There are scattered areas of
fibroglandular density.
FINDINGS: There are no findings suspicious for malignancy. Images were
processed with CAD.
IMPRESSION: No mammographic evidence of malignancy. A result letter of this
screening mammogram will be mailed directly to the patient.

RECOMMENDATION:
Screening mammogram in one year. (Code:[id number])

BI-RADS CATEGORY  1: Negative.

## 2020-08-18 ENCOUNTER — Other Ambulatory Visit: Payer: Self-pay | Admitting: Certified Nurse Midwife

## 2020-08-18 DIAGNOSIS — Z0000 Encounter for general adult medical examination without abnormal findings: Secondary | ICD-10-CM

## 2020-09-29 ENCOUNTER — Ambulatory Visit
Admission: RE | Admit: 2020-09-29 | Discharge: 2020-09-29 | Disposition: A | Discharge status: Home / Self Care | Payer: BC Managed Care – PPO | Source: Ambulatory Visit | Attending: Cardiology | Admitting: Cardiology

## 2020-09-29 ENCOUNTER — Other Ambulatory Visit: Payer: Self-pay

## 2020-09-29 DIAGNOSIS — Z1231 Encounter for screening mammogram for malignant neoplasm of breast: Secondary | ICD-10-CM | POA: Diagnosis not present

## 2020-09-29 DIAGNOSIS — Z0000 Encounter for general adult medical examination without abnormal findings: Secondary | ICD-10-CM

## 2020-10-01 DIAGNOSIS — H524 Presbyopia: Secondary | ICD-10-CM | POA: Diagnosis not present

## 2020-10-01 DIAGNOSIS — H25013 Cortical age-related cataract, bilateral: Secondary | ICD-10-CM | POA: Diagnosis not present

## 2020-10-27 NOTE — Progress Notes (Signed)
55 y.o. G0P0000 Married   Other or two or more races White or Caucasian Not Hispanic or Latino female here for annual exam and to talk about her sleep disturbance and menopausal. She states that she is thirsty at night.  She went off OCP's in 2019 and never had a cycle after that. She has tolerable vasomotor symptoms. Not sleeping well. Falls asleep well, doesn't stay asleep, thirst at night, up and down going to the bathroom. Thinks she is getting 6-7 hours of sleep at night. She has a set pattern of dinner, bedtime and when she wakes up. She is waking up with mild sweats 4-5 x a night. Very thirsty at night.  She has one cup of coffee in the am, drinks a lot of water. Works out every morning. She is voiding every time she wakes up, not necessarily feeling like she needs to voids.  Melatonin makes it worse. She has tried estroven pm without help.  All of her sleep issues started when she went off of OCP's.     C/O weight gain, up 7 lbs in the last year.   Patient's last menstrual period was 09/11/2017 (exact date).          Sexually active: Yes.    The current method of family planning is post menopausal status.    Exercising: Yes.    works out 6 days a week Smoker:  no  Health Maintenance: Pap: 10/20/19 WNL HR HPV Neg,  09-27-2017 ASCUS HPV HR neg History of abnormal Pap:  Yes LEEP 25 years ago.  MMG:  10/04/20 Density C Bi-rads 1 Neg  BMD:   None  Colonoscopy: 2017 f/u 10 years  TDaP:  2019  Gardasil: NA   reports that she has never smoked. She has never used smokeless tobacco. She reports that she does not drink alcohol and does not use drugs. Occasional ETOH. She is a IT consultant.   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   No current facility-administered medications for this visit.    Family History  Problem Relation Age of Onset   Hypertrophic cardiomyopathy Mother    Hypertension Mother    Hypertension Father    Heart disease Father    Hypertrophic cardiomyopathy Maternal Grandfather        unsure if diagnosed   Heart failure Paternal Grandmother    Cancer Paternal Grandfather 41       renal or stomach   Hypertrophic cardiomyopathy Maternal Aunt     Review of Systems  Psychiatric/Behavioral: Positive for sleep disturbance.  All other systems reviewed and are negative.   Exam:   BP 130/74    Pulse 66    Ht 5\' 2"  (1.575 m)    Wt 128 lb (58.1 kg)    LMP 09/11/2017 (Exact Date)  SpO2 98%    BMI 23.41 kg/m   Weight change: @WEIGHTCHANGE @ Height:   Height: 5\' 2"  (157.5 cm)  Ht Readings from Last 3 Encounters:  10/28/20 5\' 2"  (1.575 m)  10/20/19 5' 2.25" (1.581 m)  10/15/18 5' 1.75" (1.568 m)    General appearance: alert, cooperative and appears stated age Head: Normocephalic, without obvious abnormality, atraumatic Neck: no adenopathy, supple, symmetrical, trachea midline and thyroid normal to inspection and palpation Lungs: clear to auscultation bilaterally Cardiovascular: regular rate and rhythm Breasts: normal appearance, no masses or tenderness Abdomen: soft, non-tender; non distended,  no masses,  no organomegaly Extremities: extremities normal, atraumatic, no cyanosis or edema Skin: Skin color, texture, turgor normal. No rashes or lesions Lymph nodes: Cervical, supraclavicular, and axillary nodes normal. No abnormal inguinal nodes palpated Neurologic: Grossly  normal   Pelvic: External genitalia:  no lesions              Urethra:  normal appearing urethra with no masses, tenderness or lesions              Bartholins and Skenes: normal                 Vagina: normal appearing vagina with normal color and discharge, no lesions              Cervix: no lesions and flush with vagina               Bimanual Exam:  Uterus:  normal size, contour, position, consistency, mobility, non-tender and anteverted              Adnexa: no mass, fullness, tenderness               Rectovaginal: Confirms               Anus:  normal sphincter tone, no lesions  chaperoned for the exam.  1. Well woman exam No pap this year Mammogram and colonoscopy are UTD   2. Vasomotor symptoms due to menopause Discussed treatment options, she would like to start HRT. No contraindications, risks reviewed.  - estradiol (VIVELLE-DOT) 0.025 MG/24HR; Place 1 patch onto the skin 2 (two) times a week.  Dispense: 8 patch; Refill: 1 - PROMETRIUM 100 MG capsule; Take 1 capsule (100 mg total) by mouth daily.  Dispense: 30 capsule; Refill: 1  3. Weight gain - TSH  4. Sleep disturbance She has tried OTC agents  - estradiol (VIVELLE-DOT) 0.025 MG/24HR; Place 1 patch onto the skin 2 (two) times a week.  Dispense: 8 patch; Refill: 1 - PROMETRIUM 100 MG capsule; Take 1 capsule (100 mg total) by mouth daily.  Dispense: 30 capsule; Refill: 1  5. Polydipsia  - Hemoglobin A1c  6. Laboratory exam ordered as part of routine general medical examination  - CBC - Comprehensive metabolic panel - Lipid panel

## 2020-10-28 ENCOUNTER — Ambulatory Visit (INDEPENDENT_AMBULATORY_CARE_PROVIDER_SITE_OTHER): Payer: BC Managed Care – PPO | Admitting: Obstetrics and Gynecology

## 2020-10-28 ENCOUNTER — Encounter: Payer: Self-pay | Admitting: Obstetrics and Gynecology

## 2020-10-28 ENCOUNTER — Other Ambulatory Visit: Payer: Self-pay

## 2020-10-28 VITALS — BP 130/74 | HR 66 | Ht 62.0 in | Wt 128.0 lb

## 2020-10-28 DIAGNOSIS — N951 Menopausal and female climacteric states: Secondary | ICD-10-CM

## 2020-10-28 DIAGNOSIS — R631 Polydipsia: Secondary | ICD-10-CM | POA: Diagnosis not present

## 2020-10-28 DIAGNOSIS — G479 Sleep disorder, unspecified: Secondary | ICD-10-CM | POA: Insufficient documentation

## 2020-10-28 DIAGNOSIS — R635 Abnormal weight gain: Secondary | ICD-10-CM | POA: Diagnosis not present

## 2020-10-28 DIAGNOSIS — Z1211 Encounter for screening for malignant neoplasm of colon: Secondary | ICD-10-CM | POA: Insufficient documentation

## 2020-10-28 DIAGNOSIS — Z01419 Encounter for gynecological examination (general) (routine) without abnormal findings: Secondary | ICD-10-CM

## 2020-10-28 DIAGNOSIS — Z0000 Encounter for general adult medical examination without abnormal findings: Secondary | ICD-10-CM | POA: Diagnosis not present

## 2020-10-28 LAB — COMPREHENSIVE METABOLIC PANEL
CO2: 31 mmol/L (ref 20–32)
Creat: 0.82 mg/dL (ref 0.50–1.05)

## 2020-10-28 LAB — CBC: Platelets: 147 10*3/uL (ref 140–400)

## 2020-10-28 MED ORDER — ESTRADIOL 0.025 MG/24HR TD PTTW: 1.0000 | MEDICATED_PATCH | TRANSDERMAL | 1 refills | Status: DC

## 2020-10-28 MED ORDER — PROMETRIUM 100 MG PO CAPS: 100.0000 mg | ORAL_CAPSULE | Freq: Every day | ORAL | 1 refills | Status: DC

## 2020-10-28 NOTE — Patient Instructions (Addendum)
EXERCISE   We recommended that you start or continue a regular exercise program for good health. Physical activity is anything that gets your body moving, some is better than none. The CDC recommends 150 minutes per week of Moderate-Intensity Aerobic Activity and 2 or more days of Muscle Strengthening Activity.  Benefits of exercise are limitless: helps weight loss/weight maintenance, improves mood and energy, helps with depression and anxiety, improves sleep, tones and strengthens muscles, improves balance, improves bone density, protects from chronic conditions such as heart disease, high blood pressure and diabetes and so much more. To learn more visit: https://www.cdc.gov/physicalactivity/index.html  DIET: Good nutrition starts with a healthy diet of fruits, vegetables, whole grains, and lean protein sources. Drink plenty of water for hydration. Minimize empty calories, sodium, sweets. For more information about dietary recommendations visit: https://health.gov/our-work/nutrition-physical-activity/dietary-guidelines and https://www.myplate.gov/  ALCOHOL:  Women should limit their alcohol intake to no more than 7 drinks/beers/glasses of wine (combined, not each!) per week. Moderation of alcohol intake to this level decreases your risk of breast cancer and liver damage.  If you are concerned that you may have a problem, or your friends have told you they are concerned about your drinking, there are many resources to help. A well-known program that is free, effective, and available to all people all over the nation is Alcoholics Anonymous.  Check out this site to learn more: https://www.aa.org/   CALCIUM AND VITAMIN D:  Adequate intake of calcium and Vitamin D are recommended for bone health.  You should be getting between 1000-1200 mg of calcium and 800 units of Vitamin D daily between diet and supplements  PAP SMEARS:  Pap smears, to check for cervical cancer or precancers,  have traditionally been  done yearly, scientific advances have shown that most women can have pap smears less often.  However, every woman still should have a physical exam from her gynecologist every year. It will include a breast check, inspection of the vulva and vagina to check for abnormal growths or skin changes, a visual exam of the cervix, and then an exam to evaluate the size and shape of the uterus and ovaries. We will also provide age appropriate advice regarding health maintenance, like when you should have certain vaccines, screening for sexually transmitted diseases, bone density testing, colonoscopy, mammograms, etc.   MAMMOGRAMS:  All women over 40 years old should have a routine mammogram.   COLON CANCER SCREENING: Now recommend starting at age 45. At this time colonoscopy is not covered for routine screening until 50. There are take home tests that can be done between 45-49.   COLONOSCOPY:  Colonoscopy to screen for colon cancer is recommended for all women at age 50.  We know, you hate the idea of the prep.  We agree, BUT, having colon cancer and not knowing it is worse!!  Colon cancer so often starts as a polyp that can be seen and removed at colonscopy, which can quite literally save your life!  And if your first colonoscopy is normal and you have no family history of colon cancer, most women don't have to have it again for 10 years.  Once every ten years, you can do something that may end up saving your life, right?  We will be happy to help you get it scheduled when you are ready.  Be sure to check your insurance coverage so you understand how much it will cost.  It may be covered as a preventative service at no cost, but you should check   your particular policy.      Breast Self-Awareness Breast self-awareness means being familiar with how your breasts look and feel. It involves checking your breasts regularly and reporting any changes to your health care provider. Practicing breast self-awareness is  important. A change in your breasts can be a sign of a serious medical problem. Being familiar with how your breasts look and feel allows you to find any problems early, when treatment is more likely to be successful. All women should practice breast self-awareness, including women who have had breast implants. How to do a breast self-exam One way to learn what is normal for your breasts and whether your breasts are changing is to do a breast self-exam. To do a breast self-exam: Look for Changes  1. Remove all the clothing above your waist. 2. Stand in front of a mirror in a room with good lighting. 3. Put your hands on your hips. 4. Push your hands firmly downward. 5. Compare your breasts in the mirror. Look for differences between them (asymmetry), such as: ? Differences in shape. ? Differences in size. ? Puckers, dips, and bumps in one breast and not the other. 6. Look at each breast for changes in your skin, such as: ? Redness. ? Scaly areas. 7. Look for changes in your nipples, such as: ? Discharge. ? Bleeding. ? Dimpling. ? Redness. ? A change in position. Feel for Changes Carefully feel your breasts for lumps and changes. It is best to do this while lying on your back on the floor and again while sitting or standing in the shower or tub with soapy water on your skin. Feel each breast in the following way:  Place the arm on the side of the breast you are examining above your head.  Feel your breast with the other hand.  Start in the nipple area and make  inch (2 cm) overlapping circles to feel your breast. Use the pads of your three middle fingers to do this. Apply light pressure, then medium pressure, then firm pressure. The light pressure will allow you to feel the tissue closest to the skin. The medium pressure will allow you to feel the tissue that is a little deeper. The firm pressure will allow you to feel the tissue close to the ribs.  Continue the overlapping circles,  moving downward over the breast until you feel your ribs below your breast.  Move one finger-width toward the center of the body. Continue to use the  inch (2 cm) overlapping circles to feel your breast as you move slowly up toward your collarbone.  Continue the up and down exam using all three pressures until you reach your armpit.  Write Down What You Find  Write down what is normal for each breast and any changes that you find. Keep a written record with breast changes or normal findings for each breast. By writing this information down, you do not need to depend only on memory for size, tenderness, or location. Write down where you are in your menstrual cycle, if you are still menstruating. If you are having trouble noticing differences in your breasts, do not get discouraged. With time you will become more familiar with the variations in your breasts and more comfortable with the exam. How often should I examine my breasts? Examine your breasts every month. If you are breastfeeding, the best time to examine your breasts is after a feeding or after using a breast pump. If you menstruate, the best time to   examine your breasts is 5-7 days after your period is over. During your period, your breasts are lumpier, and it may be more difficult to notice changes. When should I see my health care provider? See your health care provider if you notice:  A change in shape or size of your breasts or nipples.  A change in the skin of your breast or nipples, such as a reddened or scaly area.  Unusual discharge from your nipples.  A lump or thick area that was not there before.  Pain in your breasts.  Anything that concerns you.  Menopause and Hormone Replacement Therapy Menopause is a normal time of life when menstrual periods stop completely and the ovaries stop producing the female hormones estrogen and progesterone. Low levels of these hormones can affect your health and cause symptoms. Hormone  replacement therapy (HRT) can relieve some of those symptoms. HRT is the use of artificial (synthetic) hormones to replace hormones that your body has stopped producing because you have reached menopause. Types of HRT HRT may consist of the synthetic hormones estrogen and progestin, or it may consist of estrogen-only therapy. You and your health care provider will decide which form of HRT is best for you. If you choose to be on HRT and you have a uterus, estrogen and progestin are usually prescribed. Estrogen-only therapy is used for women who do not have a uterus. Possible options for taking HRT include:  Pills.  Patches.  Gels.  Sprays.  Vaginal cream.  Vaginal rings.  Vaginal inserts. The amount of hormones that you take and how long you take them varies according to your health. It is important to:  Begin HRT with the lowest possible dosage.  Stop HRT as soon as your health care provider tells you to stop.  Work with your health care provider so that you feel informed and comfortable with your decisions.   Tell a health care provider about:  Any allergies you have.  Whether you have had blood clots or know of any risk factors you may have for blood clots.  Whether you or family members have had cancer, especially cancer of the breasts, ovaries, or uterus.  Any surgeries you have had.  All medicines you are taking, including vitamins, herbs, eye drops, creams, and over-the-counter medicines.  Whether you are pregnant or may be pregnant.  Any medical conditions you have. What are the benefits? HRT can reduce the frequency and severity of menopausal symptoms. Benefits of HRT vary according to the kind of symptoms that you have, how severe they are, and your overall health. HRT may help to improve the following symptoms of menopause:  Hot flashes and night sweats. These are sudden feelings of heat that spread over the face and body. The skin may turn red, like a blush.  Night sweats are hot flashes that happen while you are sleeping or trying to sleep.  Bone loss (osteoporosis). The body loses calcium more quickly after menopause, causing the bones to become weaker. This can increase the risk for bone breaks (fractures).  Vaginal dryness. The lining of the vagina can become thin and dry, which can cause pain during sex or cause infection, burning, or itching.  Urinary tract infections.  Urinary incontinence. This is the inability to control when you urinate.  Irritability.  Short-term memory problems. What are the risks? Risks of HRT vary depending on your individual health and medical history. Risks of HRT also depend on whether you receive both estrogen and progestin   or you receive estrogen only. HRT may increase the risk of:  Spotting. This is when a small amount of blood leaks from the vagina unexpectedly.  Endometrial cancer. This cancer is in the lining of the uterus (endometrium).  Breast cancer.  Increased density of breast tissue. This can make it harder to find breast cancer on a breast X-ray (mammogram).  Stroke.  Heart disease.  Blood clots.  Gallbladder disease or liver disease. Risks of HRT can increase if you have any of the following conditions:  Endometrial cancer.  Liver disease.  Heart disease.  Breast cancer.  History of blood clots.  History of stroke. Follow these instructions at home: Pap tests  Have Pap tests done as often as told by your health care provider. A Pap test is sometimes called a Pap smear. It is a screening test that is used to check for signs of cancer of the cervix and vagina. A Pap test can also identify the presence of infection or precancerous changes. Pap tests may be done: ? Every 3 years, starting at age 21. ? Every 5 years, starting after age 30, in combination with testing for human papillomavirus (HPV). ? More often or less often depending on other medical conditions you have, your  age, and other risk factors.  It is up to you to get the results of your Pap test. Ask your health care provider, or the department that is doing the test, when your results will be ready. General instructions  Take over-the-counter and prescription medicines only as told by your health care provider.  Do not use any products that contain nicotine or tobacco. These products include cigarettes, chewing tobacco, and vaping devices, such as e-cigarettes. If you need help quitting, ask your health care provider.  Get mammograms, pelvic exams, and medical checkups as often as told by your health care provider.  Keep all follow-up visits. This is important. Contact a health care provider if you have:  Pain or swelling in your legs.  Lumps or changes in your breasts or armpits.  Pain, burning, or bleeding when you urinate.  Unusual vaginal bleeding.  Dizziness or headaches.  Pain in your abdomen. Get help right away if you have:  Shortness of breath.  Chest pain.  Slurred speech.  Weakness or numbness in any part of your arms or legs. These symptoms may represent a serious problem that is an emergency. Do not wait to see if the symptoms will go away. Get medical help right away. Call your local emergency services (911 in the [place] Do not drive yourself to the hospital. Summary  Menopause is a normal time of life when menstrual periods stop completely and the ovaries stop producing the female hormones estrogen and progesterone.  HRT can reduce the frequency and severity of menopausal symptoms.  Risks of HRT vary depending on your individual health and medical history. This information is not intended to replace advice given to you by your health care provider. Make sure you discuss any questions you have with your health care provider. Document Revised: 02/02/2020 Document Reviewed: 02/02/2020 Elsevier Patient Education  2021 Elsevier Inc.  

## 2020-10-29 ENCOUNTER — Other Ambulatory Visit: Payer: Self-pay | Admitting: *Deleted

## 2020-10-29 LAB — COMPREHENSIVE METABOLIC PANEL
AG Ratio: 2.2 (calc) (ref 1.0–2.5)
ALT: 28 U/L (ref 6–29)
AST: 22 U/L (ref 10–35)
Albumin: 4.6 g/dL (ref 3.6–5.1)
Alkaline phosphatase (APISO): 76 U/L (ref 37–153)
BUN: 18 mg/dL (ref 7–25)
Calcium: 9.6 mg/dL (ref 8.6–10.4)
Chloride: 100 mmol/L (ref 98–110)
Globulin: 2.1 g/dL (calc) (ref 1.9–3.7)
Glucose, Bld: 92 mg/dL (ref 65–99)
Potassium: 4.3 mmol/L (ref 3.5–5.3)
Sodium: 138 mmol/L (ref 135–146)
Total Bilirubin: 0.5 mg/dL (ref 0.2–1.2)
Total Protein: 6.7 g/dL (ref 6.1–8.1)

## 2020-10-29 LAB — LIPID PANEL
Cholesterol: 169 mg/dL (ref <200)
HDL: 50 mg/dL (ref >=50)
LDL Cholesterol (Calc): 101 mg/dL (calc) — ABNORMAL HIGH
Non-HDL Cholesterol (Calc): 119 mg/dL (calc) (ref <130)
Total CHOL/HDL Ratio: 3.4 (calc) (ref <5.0)
Triglycerides: 87 mg/dL (ref <150)

## 2020-10-29 LAB — CBC
HCT: 46.5 % — ABNORMAL HIGH (ref 35.0–45.0)
Hemoglobin: 14.9 g/dL (ref 11.7–15.5)
MCH: 28.4 pg (ref 27.0–33.0)
MCHC: 32 g/dL (ref 32.0–36.0)
MCV: 88.6 fL (ref 80.0–100.0)
MPV: 11.1 fL (ref 7.5–12.5)
RBC: 5.25 10*6/uL — ABNORMAL HIGH (ref 3.80–5.10)
RDW: 13 % (ref 11.0–15.0)
WBC: 4.7 10*3/uL (ref 3.8–10.8)

## 2020-10-29 LAB — HEMOGLOBIN A1C
Hgb A1c MFr Bld: 5.6 % of total Hgb (ref <5.7)
Mean Plasma Glucose: 114 mg/dL
eAG (mmol/L): 6.3 mmol/L

## 2020-10-29 LAB — TSH: TSH: 2.13 mIU/L

## 2020-10-29 NOTE — Telephone Encounter (Signed)
Patient insurance company  will not cover Prometrium. Can we prescribe and refill the generic for patient. Will route to MD for recommendations.

## 2020-10-31 ENCOUNTER — Encounter: Payer: Self-pay | Admitting: Obstetrics and Gynecology

## 2020-10-31 NOTE — Telephone Encounter (Signed)
Yes, please refill the generic. Thanks.

## 2020-11-01 MED ORDER — PROGESTERONE MICRONIZED 100 MG PO CAPS: 100.0000 mg | ORAL_CAPSULE | Freq: Every day | ORAL | 1 refills | Status: DC

## 2020-11-03 ENCOUNTER — Telehealth: Payer: Self-pay

## 2020-11-03 NOTE — Telephone Encounter (Signed)
Note from pharmacy faxed regarding patient's Prometrium Rx. " Rx says Brand medicall necessary but patient's insurance does not cover brand. Patient has asked [place] to request generic."  Upon review of chart this was handled on 10/31/20 and Rx has already been sent.

## 2020-12-02 ENCOUNTER — Ambulatory Visit: Payer: BC Managed Care – PPO | Admitting: Obstetrics and Gynecology

## 2020-12-06 ENCOUNTER — Encounter: Payer: Self-pay | Admitting: Obstetrics and Gynecology

## 2020-12-07 NOTE — Telephone Encounter (Signed)
Please let the patient know that there is a pill she could take, I typically start with the patch because the risk is lower with the patch. If she feels she can't tolerate it, or it is too expensive, she can try estrace 0.5 mg po (#30, no refills). She would still need to take the prometrium.

## 2020-12-13 ENCOUNTER — Other Ambulatory Visit: Payer: Self-pay | Admitting: Obstetrics and Gynecology

## 2020-12-13 ENCOUNTER — Encounter: Payer: Self-pay | Admitting: Obstetrics and Gynecology

## 2020-12-13 DIAGNOSIS — N951 Menopausal and female climacteric states: Secondary | ICD-10-CM

## 2020-12-13 DIAGNOSIS — G479 Sleep disorder, unspecified: Secondary | ICD-10-CM

## 2020-12-13 MED ORDER — ESTRADIOL 0.025 MG/24HR TD PTTW: 1.0000 | MEDICATED_PATCH | TRANSDERMAL | 3 refills | Status: DC

## 2020-12-13 MED ORDER — PROGESTERONE MICRONIZED 100 MG PO CAPS: 100.0000 mg | ORAL_CAPSULE | Freq: Every day | ORAL | 3 refills | Status: DC

## 2020-12-13 NOTE — Progress Notes (Signed)
Refills for HRT sent.

## 2020-12-16 ENCOUNTER — Ambulatory Visit: Payer: BC Managed Care – PPO | Admitting: Obstetrics and Gynecology

## 2020-12-16 ENCOUNTER — Other Ambulatory Visit: Payer: Self-pay

## 2020-12-16 DIAGNOSIS — N951 Menopausal and female climacteric states: Secondary | ICD-10-CM

## 2020-12-16 DIAGNOSIS — G479 Sleep disorder, unspecified: Secondary | ICD-10-CM

## 2020-12-16 MED ORDER — ESTRADIOL 0.025 MG/24HR TD PTTW: 1.0000 | MEDICATED_PATCH | TRANSDERMAL | 3 refills | Status: DC

## 2020-12-16 MED ORDER — PROGESTERONE MICRONIZED 100 MG PO CAPS: 100.0000 mg | ORAL_CAPSULE | Freq: Every day | ORAL | 3 refills | Status: DC

## 2020-12-16 NOTE — Telephone Encounter (Signed)
HRT Rx's were prescribed 12/13/20 but patient is getting through mail order and changing pharmacy.

## 2021-08-26 ENCOUNTER — Other Ambulatory Visit: Payer: Self-pay | Admitting: Obstetrics and Gynecology

## 2021-08-26 DIAGNOSIS — Z1231 Encounter for screening mammogram for malignant neoplasm of breast: Secondary | ICD-10-CM

## 2021-10-05 ENCOUNTER — Ambulatory Visit
Admission: RE | Admit: 2021-10-05 | Discharge: 2021-10-05 | Disposition: A | Discharge status: Home / Self Care | Payer: BC Managed Care – PPO | Source: Ambulatory Visit | Attending: Obstetrics and Gynecology | Admitting: Obstetrics and Gynecology

## 2021-10-05 DIAGNOSIS — Z1231 Encounter for screening mammogram for malignant neoplasm of breast: Secondary | ICD-10-CM

## 2021-10-06 ENCOUNTER — Other Ambulatory Visit: Payer: Self-pay | Admitting: Obstetrics and Gynecology

## 2021-10-06 DIAGNOSIS — R928 Other abnormal and inconclusive findings on diagnostic imaging of breast: Secondary | ICD-10-CM

## 2021-10-14 DIAGNOSIS — H5213 Myopia, bilateral: Secondary | ICD-10-CM | POA: Diagnosis not present

## 2021-10-14 DIAGNOSIS — H25013 Cortical age-related cataract, bilateral: Secondary | ICD-10-CM | POA: Diagnosis not present

## 2021-10-26 NOTE — Progress Notes (Signed)
56 y.o. G0P0000 Married   ?Other or two or more races ?White or Caucasian Not Hispanic or Latino female here for annual exam.  She is on HRT, vasomotor symptoms have improved and are tolerable. She just doesn't sleep well. Wakes up 2-3 x a night, typically can go back to sleep. She has tried CBD, which has helped. No help with melatonin.  ?No vaginal bleeding. Sexually active, no pain as long as she uses lubrication. ? ?No bowel or bladder issues. No medical changes.  ?  ?She has always had a lot of body hair, no change but it bothers her. She feels it is excessive.  ? ?She gets constant irritation from the patch, wants to try oral estrogen. Aware of risks.  ? ?Patient's last menstrual period was 09/11/2017 (exact date).          ?Sexually active: Yes.    ?The current method of family planning is post menopausal status.    ?Exercising: Yes.    Gym/ health club routine includes cardio and light weights. ?Smoker:  no ? ?Health Maintenance: ?Pap:  10/20/19 WNL HR HPV Neg,  09-27-2017 ASCUS HPV HR neg ?History of abnormal Pap:  Yes LEEP 25+ years ago.  ?MMG:  10/05/21 Incomplete see epic follow up on 10/27/21 was normal  ?BMD:   none  ?Colonoscopy: 2017 f/u 10 years  ?TDaP:  2019  ?Gardasil: n/a ? ? reports that she has never smoked. She has never used smokeless tobacco. She reports that she does not drink alcohol and does not use drugs. Rare ETOH. Works as a IT consultant.  ? ?Past Medical History:  ?Diagnosis Date  ? Fibrocystic breast disease (FCBD)   ? Hyperplastic colon polyp 06/30/2016  ? Menstrual migraine   ? Pap smear abnormality of cervix with HGSIL 4/90  ? 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 Pap smear abnormality of cervix with HGSIL 1/92 - 2/92  ? persistent CIN II treated with Efudex  ? Pap smear abnormality of cervix with HGSIL 04/2007  ? LEEP for recurent CIN III  ? ? ?Past Surgical History:  ?Procedure Laterality Date  ? CERVICAL BIOPSY  W/ LOOP ELECTRODE EXCISION  9/08  ? recurrent CIN-III  ? COLPOSCOPY    ? FOOT  NEUROMA SURGERY Left   ? ? ?Current Outpatient Medications  ?Medication Sig Dispense Refill  ? estradiol (VIVELLE-DOT) 0.025 MG/24HR Place 1 patch onto the skin 2 (two) times a week. 24 patch 3  ? progesterone (PROMETRIUM) 100 MG capsule Take 1 capsule (100 mg total) by mouth daily. 90 capsule 3  ? ?No current facility-administered medications for this visit.  ? ? ?Family History  ?Problem Relation Age of Onset  ? Hypertrophic cardiomyopathy Mother   ? Hypertension Mother   ? Hypertension Father   ? Heart disease Father   ? Hypertrophic cardiomyopathy Maternal Grandfather   ?     unsure if diagnosed  ? Heart failure Paternal Grandmother   ? Cancer Paternal Grandfather 50  ?     renal or stomach  ? Hypertrophic cardiomyopathy Maternal Aunt   ? ? ?Review of Systems  ?All other systems reviewed and are negative. ? ?Exam:   ?BP 122/64   Pulse 64   Ht 5\' 1"  (1.549 m)   Wt 126 lb (57.2 kg)   LMP 09/11/2017 (Exact Date)   SpO2 99%   BMI 23.81 kg/m?   Weight change: @WEIGHTCHANGE @ Height:   Height: 5\' 1"  (154.9 cm)  ?Ht Readings from Last 3  Encounters:  ?11/03/21 5\' 1"  (1.549 m)  ?10/28/20 5\' 2"  (1.575 m)  ?10/20/19 5' 2.25" (1.581 m)  ? ? ?General appearance: alert, cooperative and appears stated age ?Head: Normocephalic, without obvious abnormality, atraumatic ?Neck: no adenopathy, supple, symmetrical, trachea midline and thyroid normal to inspection and palpation ?Lungs: clear to auscultation bilaterally ?Cardiovascular: regular rate and rhythm ?Breasts: normal appearance, no masses or tenderness ?Abdomen: soft, non-tender; non distended,  no masses,  no organomegaly ?Extremities: extremities normal, atraumatic, no cyanosis or edema ?Skin: Skin color, texture, turgor normal. No rashes or lesions ?Lymph nodes: Cervical, supraclavicular, and axillary nodes normal. ?No abnormal inguinal nodes palpated ?Neurologic: Grossly normal ? ? ?Pelvic: External genitalia:  no lesions ?             Urethra:  normal appearing  urethra with no masses, tenderness or lesions ?             Bartholins and Skenes: normal    ?             Vagina: atrophic appearing vagina with normal color and discharge, no lesions ?             Cervix: no lesions and flush with the vagina ?              ?Bimanual Exam:  Uterus:  normal size, contour, position, consistency, mobility, non-tender ?             Adnexa: no mass, fullness, tenderness ?              Rectovaginal: Confirms ?              Anus:  normal sphincter tone, no lesions ? ? chaperoned for the exam. ? ?1. Well woman exam ?Discussed breast self exam ?Discussed calcium and vit D intake ?No pap this year ?Mammogram and colonoscopy are UTD ? ?2. Hormone replacement therapy ?Wants to change to oral estrogen, aware of risks ?- estradiol (ESTRACE) 0.5 MG tablet; Take 1 tablet (0.5 mg total) by mouth daily.  Dispense: 90 tablet; Refill: 3 ?- progesterone (PROMETRIUM) 100 MG capsule; Take 1 capsule (100 mg total) by mouth daily.  Dispense: 90 capsule; Refill: 3 ? ?3. Hirsutism ?- 17-Hydroxyprogesterone ?- Testosterone, Total, LC/MS/MS ? ?4. Elevated LDL cholesterol level ?- Lipid panel ? ?5. Laboratory exam ordered as part of routine general medical examination ?- CBC ?- Comprehensive metabolic panel ?- Lipid panel ? ?    ? ? ? ?

## 2021-10-27 ENCOUNTER — Ambulatory Visit: Payer: BC Managed Care – PPO

## 2021-10-27 ENCOUNTER — Other Ambulatory Visit: Payer: Self-pay

## 2021-10-27 ENCOUNTER — Ambulatory Visit
Admission: RE | Admit: 2021-10-27 | Discharge: 2021-10-27 | Disposition: A | Discharge status: Home / Self Care | Payer: BC Managed Care – PPO | Source: Ambulatory Visit | Attending: Obstetrics and Gynecology | Admitting: Obstetrics and Gynecology

## 2021-10-27 DIAGNOSIS — R922 Inconclusive mammogram: Secondary | ICD-10-CM | POA: Diagnosis not present

## 2021-10-28 DIAGNOSIS — H25013 Cortical age-related cataract, bilateral: Secondary | ICD-10-CM | POA: Diagnosis not present

## 2021-10-28 DIAGNOSIS — H10413 Chronic giant papillary conjunctivitis, bilateral: Secondary | ICD-10-CM | POA: Diagnosis not present

## 2021-11-03 ENCOUNTER — Other Ambulatory Visit: Payer: Self-pay

## 2021-11-03 ENCOUNTER — Ambulatory Visit (INDEPENDENT_AMBULATORY_CARE_PROVIDER_SITE_OTHER): Payer: BC Managed Care – PPO | Admitting: Obstetrics and Gynecology

## 2021-11-03 ENCOUNTER — Encounter: Payer: Self-pay | Admitting: Obstetrics and Gynecology

## 2021-11-03 VITALS — BP 122/64 | HR 64 | Ht 61.0 in | Wt 126.0 lb

## 2021-11-03 DIAGNOSIS — Z0000 Encounter for general adult medical examination without abnormal findings: Secondary | ICD-10-CM | POA: Diagnosis not present

## 2021-11-03 DIAGNOSIS — E7800 Pure hypercholesterolemia, unspecified: Secondary | ICD-10-CM

## 2021-11-03 DIAGNOSIS — Z79890 Hormone replacement therapy (postmenopausal): Secondary | ICD-10-CM

## 2021-11-03 DIAGNOSIS — Z01419 Encounter for gynecological examination (general) (routine) without abnormal findings: Secondary | ICD-10-CM

## 2021-11-03 DIAGNOSIS — L680 Hirsutism: Secondary | ICD-10-CM | POA: Diagnosis not present

## 2021-11-03 MED ORDER — ESTRADIOL 0.5 MG PO TABS: 0.5000 mg | ORAL_TABLET | Freq: Every day | ORAL | 3 refills | Status: DC

## 2021-11-03 MED ORDER — PROGESTERONE MICRONIZED 100 MG PO CAPS: 100.0000 mg | ORAL_CAPSULE | Freq: Every day | ORAL | 3 refills | Status: DC

## 2021-11-03 NOTE — Patient Instructions (Signed)

## 2021-11-04 ENCOUNTER — Telehealth: Payer: Self-pay

## 2021-11-04 NOTE — Telephone Encounter (Signed)
Vanessa Mckee from [facility] pharmacy called to confirm that patient is changing from the estrogen patch to oral tablets. Per your notes from visit on 11/03/21, the therapy has [facility] pharmacy notified.  ?

## 2021-11-08 LAB — COMPREHENSIVE METABOLIC PANEL
AG Ratio: 2.3 (calc) (ref 1.0–2.5)
ALT: 21 U/L (ref 6–29)
AST: 20 U/L (ref 10–35)
Albumin: 4.6 g/dL (ref 3.6–5.1)
Alkaline phosphatase (APISO): 60 U/L (ref 37–153)
BUN: 18 mg/dL (ref 7–25)
CO2: 28 mmol/L (ref 20–32)
Calcium: 9.1 mg/dL (ref 8.6–10.4)
Chloride: 104 mmol/L (ref 98–110)
Creat: 0.82 mg/dL (ref 0.50–1.03)
Globulin: 2 g/dL (calc) (ref 1.9–3.7)
Glucose, Bld: 83 mg/dL (ref 65–99)
Potassium: 4 mmol/L (ref 3.5–5.3)
Sodium: 139 mmol/L (ref 135–146)
Total Bilirubin: 0.9 mg/dL (ref 0.2–1.2)
Total Protein: 6.6 g/dL (ref 6.1–8.1)

## 2021-11-08 LAB — CBC
HCT: 43.4 % (ref 35.0–45.0)
Hemoglobin: 14.3 g/dL (ref 11.7–15.5)
MCH: 28.9 pg (ref 27.0–33.0)
MCHC: 32.9 g/dL (ref 32.0–36.0)
MCV: 87.7 fL (ref 80.0–100.0)
MPV: 11.5 fL (ref 7.5–12.5)
Platelets: 163 10*3/uL (ref 140–400)
RBC: 4.95 10*6/uL (ref 3.80–5.10)
RDW: 12.7 % (ref 11.0–15.0)
WBC: 5.2 10*3/uL (ref 3.8–10.8)

## 2021-11-08 LAB — LIPID PANEL
Cholesterol: 161 mg/dL (ref <200)
HDL: 59 mg/dL (ref >=50)
LDL Cholesterol (Calc): 88 mg/dL (calc)
Non-HDL Cholesterol (Calc): 102 mg/dL (calc) (ref <130)
Total CHOL/HDL Ratio: 2.7 (calc) (ref <5.0)
Triglycerides: 58 mg/dL (ref <150)

## 2021-11-08 LAB — TESTOSTERONE, TOTAL, LC/MS/MS: Testosterone, Total, LC-MS-MS: 12 ng/dL (ref 2–45)

## 2021-11-08 LAB — 17-HYDROXYPROGESTERONE: 17-OH-Progesterone, LC/MS/MS: 10 ng/dL

## 2021-11-11 DIAGNOSIS — H10413 Chronic giant papillary conjunctivitis, bilateral: Secondary | ICD-10-CM | POA: Diagnosis not present

## 2021-11-17 DIAGNOSIS — L814 Other melanin hyperpigmentation: Secondary | ICD-10-CM | POA: Diagnosis not present

## 2021-11-17 DIAGNOSIS — L84 Corns and callosities: Secondary | ICD-10-CM | POA: Diagnosis not present

## 2021-11-17 DIAGNOSIS — D485 Neoplasm of uncertain behavior of skin: Secondary | ICD-10-CM | POA: Diagnosis not present

## 2021-11-17 DIAGNOSIS — L820 Inflamed seborrheic keratosis: Secondary | ICD-10-CM | POA: Diagnosis not present

## 2021-11-17 DIAGNOSIS — D225 Melanocytic nevi of trunk: Secondary | ICD-10-CM | POA: Diagnosis not present

## 2021-11-17 DIAGNOSIS — D1801 Hemangioma of skin and subcutaneous tissue: Secondary | ICD-10-CM | POA: Diagnosis not present

## 2021-12-02 DIAGNOSIS — J3089 Other allergic rhinitis: Secondary | ICD-10-CM | POA: Diagnosis not present

## 2021-12-02 DIAGNOSIS — H1045 Other chronic allergic conjunctivitis: Secondary | ICD-10-CM | POA: Diagnosis not present

## 2021-12-02 DIAGNOSIS — T783XXA Angioneurotic edema, initial encounter: Secondary | ICD-10-CM | POA: Diagnosis not present

## 2022-08-29 ENCOUNTER — Other Ambulatory Visit: Payer: Self-pay | Admitting: Obstetrics and Gynecology

## 2022-08-29 DIAGNOSIS — Z1231 Encounter for screening mammogram for malignant neoplasm of breast: Secondary | ICD-10-CM

## 2022-10-02 ENCOUNTER — Encounter: Payer: Self-pay | Admitting: Obstetrics and Gynecology

## 2022-10-02 DIAGNOSIS — Z79890 Hormone replacement therapy (postmenopausal): Secondary | ICD-10-CM

## 2022-10-03 MED ORDER — PROGESTERONE MICRONIZED 100 MG PO CAPS: 100.0000 mg | ORAL_CAPSULE | Freq: Every day | ORAL | 0 refills | Status: DC

## 2022-10-03 MED ORDER — ESTRADIOL 0.5 MG PO TABS: 0.5000 mg | ORAL_TABLET | Freq: Every day | ORAL | 0 refills | Status: DC

## 2022-10-03 NOTE — Telephone Encounter (Signed)
AEX 11/03/21 Scheduled AEX 11/08/22  Screening mammo 10/05/21  NEG Scheduled mammo 10/11/22

## 2022-10-04 MED ORDER — PROGESTERONE MICRONIZED 100 MG PO CAPS: 100.0000 mg | ORAL_CAPSULE | Freq: Every day | ORAL | 0 refills | Status: DC

## 2022-10-04 MED ORDER — ESTRADIOL 0.5 MG PO TABS: 0.5000 mg | ORAL_TABLET | Freq: Every day | ORAL | 0 refills | Status: DC

## 2022-10-04 NOTE — Addendum Note (Signed)
Addended by: Judy Pimple D on: 10/04/2022 08:34 AM   Modules accepted: Orders

## 2022-10-04 NOTE — Telephone Encounter (Signed)
FYI. Resent 36mosupply w/ 0 refills to desired pharmacy and notified pt. Will route to provider for final review.

## 2022-10-11 ENCOUNTER — Ambulatory Visit
Admission: RE | Admit: 2022-10-11 | Discharge: 2022-10-11 | Disposition: A | Discharge status: Home / Self Care | Payer: BC Managed Care – PPO | Source: Ambulatory Visit | Attending: Obstetrics and Gynecology | Admitting: Obstetrics and Gynecology

## 2022-10-11 DIAGNOSIS — Z1231 Encounter for screening mammogram for malignant neoplasm of breast: Secondary | ICD-10-CM

## 2022-10-19 DIAGNOSIS — H5213 Myopia, bilateral: Secondary | ICD-10-CM | POA: Diagnosis not present

## 2022-10-19 DIAGNOSIS — H25013 Cortical age-related cataract, bilateral: Secondary | ICD-10-CM | POA: Diagnosis not present

## 2022-10-30 NOTE — Progress Notes (Signed)
57 y.o. G0P0000 Married   Other or two or more races White or Caucasian Not Hispanic or Latino female here for annual exam.  She is on HRT, doing well, wants to continue. Still has sleep issues. No vaginal bleeding, no dyspareunia as long as she uses lubricant.   No bowel or bladder c/o.   Patient's last menstrual period was 09/11/2017 (exact date).          Sexually active: Yes.    The current method of family planning is post menopausal status.    Exercising: Yes.    Gym/ health club routine includes cardio, light weights, and yoga. Smoker:  no  Health Maintenance: Pap:  10/20/19 WNL HR HPV Neg,  09-27-2017 ASCUS HPV HR neg History of abnormal Pap:  Yes LEEP in 2008  MMG:  10/11/22 density B Bi-rads 1 neg  BMD:     none  Colonoscopy: 2017, benign polyp, f/u 10 years  TDaP:  2019  Gardasil: n/a   reports that she has never smoked. She has never used smokeless tobacco. She reports that she does not drink alcohol and does not use drugs. Just occasional ETOH. Works as a Radio broadcast assistant.   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 90 tablet 0   progesterone (PROMETRIUM) 100 MG capsule Take 1 capsule (100 mg total) by mouth daily. 90 capsule 0   No current facility-administered medications for this visit.    Family History   Problem Relation Age of Onset   Hypertrophic cardiomyopathy Mother    Hypertension Mother    Hypertension Father    Heart disease Father    Hypertrophic cardiomyopathy Maternal Grandfather        unsure if diagnosed   Heart failure Paternal Grandmother    Cancer Paternal Grandfather 4       renal or stomach   Hypertrophic cardiomyopathy Maternal Aunt     Review of Systems  All other systems reviewed and are negative.   Exam:   BP 132/70   Pulse 87   Ht 5' 1.81" (1.57 m)   Wt 112 lb (50.8 kg)   LMP 09/11/2017 (Exact Date)   SpO2 100%   BMI 20.61 kg/m   Weight change: @WEIGHTCHANGE @ Height:   Height: 5' 1.81" (157 cm)  Ht Readings from Last 3 Encounters:  11/08/22 5' 1.81" (1.57 m)  11/03/21 5\' 1"  (1.549 m)  10/28/20 5\' 2"  (1.575 m)    General appearance: alert, cooperative and appears stated age Head: Normocephalic, without obvious abnormality, atraumatic Neck: no adenopathy, supple, symmetrical, trachea midline and thyroid normal to inspection and palpation Lungs: clear to auscultation bilaterally Cardiovascular: regular rate and rhythm Breasts: normal appearance, no masses or tenderness Abdomen: soft, non-tender;  non distended,  no masses,  no organomegaly Extremities: extremities normal, atraumatic, no cyanosis or edema Skin: Skin color, texture, turgor normal. No rashes or lesions Lymph nodes: Cervical, supraclavicular, and axillary nodes normal. No abnormal inguinal nodes palpated Neurologic: Grossly normal   Pelvic: External genitalia:  no lesions              Urethra:  normal appearing urethra with no masses, tenderness or lesions              Bartholins and Skenes: normal                 Vagina: normal appearing vagina with normal color and discharge, no lesions              Cervix: no lesions               Bimanual Exam:  Uterus:  normal size, contour, position, consistency, mobility, non-tender              Adnexa: no mass, fullness, tenderness                Rectovaginal: Confirms               Anus:  normal sphincter tone, no lesions   1. Well woman exam Discussed breast self exam Discussed calcium and vit D intake Mammogram and colonoscopy are utd Labs are UTD  2. Screening for cervical cancer - Cytology - PAP  3. Hormone replacement therapy Doing well, wants to continue, aware of risks - estradiol (ESTRACE) 0.5 MG tablet; Take 1 tablet (0.5 mg total) by mouth daily.  Dispense: 90 tablet; Refill: 3 - progesterone (PROMETRIUM) 100 MG capsule; Take 1 capsule (100 mg total) by mouth daily.  Dispense: 90 capsule; Refill: 3

## 2022-11-08 ENCOUNTER — Ambulatory Visit (INDEPENDENT_AMBULATORY_CARE_PROVIDER_SITE_OTHER): Payer: BC Managed Care – PPO | Admitting: Obstetrics and Gynecology

## 2022-11-08 ENCOUNTER — Encounter: Payer: Self-pay | Admitting: Obstetrics and Gynecology

## 2022-11-08 ENCOUNTER — Other Ambulatory Visit (HOSPITAL_COMMUNITY)
Admission: RE | Admit: 2022-11-08 | Discharge: 2022-11-08 | Disposition: A | Discharge status: Home / Self Care | Payer: BC Managed Care – PPO | Source: Ambulatory Visit | Attending: Obstetrics and Gynecology | Admitting: Obstetrics and Gynecology

## 2022-11-08 VITALS — BP 132/70 | HR 87 | Ht 61.81 in | Wt 112.0 lb

## 2022-11-08 DIAGNOSIS — Z79890 Hormone replacement therapy (postmenopausal): Secondary | ICD-10-CM | POA: Diagnosis not present

## 2022-11-08 DIAGNOSIS — Z124 Encounter for screening for malignant neoplasm of cervix: Secondary | ICD-10-CM

## 2022-11-08 DIAGNOSIS — Z01419 Encounter for gynecological examination (general) (routine) without abnormal findings: Secondary | ICD-10-CM | POA: Diagnosis not present

## 2022-11-08 MED ORDER — ESTRADIOL 0.5 MG PO TABS: 0.5000 mg | ORAL_TABLET | Freq: Every day | ORAL | 3 refills | Status: DC

## 2022-11-08 MED ORDER — PROGESTERONE MICRONIZED 100 MG PO CAPS: 100.0000 mg | ORAL_CAPSULE | Freq: Every day | ORAL | 3 refills | Status: DC

## 2022-11-08 NOTE — Patient Instructions (Signed)

## 2022-11-10 LAB — CYTOLOGY - PAP
Comment: NEGATIVE
Diagnosis: NEGATIVE
High risk HPV: NEGATIVE

## 2023-01-07 ENCOUNTER — Encounter: Payer: Self-pay | Admitting: Obstetrics and Gynecology

## 2023-01-09 MED ORDER — SCOPOLAMINE 1 MG/3DAYS TD PT72: 1.0000 | MEDICATED_PATCH | TRANSDERMAL | 0 refills | Status: DC

## 2023-01-11 ENCOUNTER — Other Ambulatory Visit: Payer: Self-pay

## 2023-07-05 ENCOUNTER — Other Ambulatory Visit: Payer: Self-pay

## 2023-07-05 NOTE — Telephone Encounter (Signed)
Med refill request: Transderm-Scop Last AEX: 11/08/22 Next AEX: n/a Last MMG (if hormonal med) 10/11/22 Refill authorized: Please Advise, #[phone]

## 2023-07-11 MED ORDER — SCOPOLAMINE 1 MG/3DAYS TD PT72: 1.0000 | MEDICATED_PATCH | TRANSDERMAL | 0 refills | Status: DC

## 2023-07-11 NOTE — Telephone Encounter (Signed)
Patient confirmed [employer].  Patient notified of Refill.

## 2023-07-11 NOTE — Telephone Encounter (Signed)
Spoke with patient. Patient states she uses the scopolamine patches when cruising for nausea.   Routing to Dr. Edward Jolly.

## 2023-07-18 DIAGNOSIS — L814 Other melanin hyperpigmentation: Secondary | ICD-10-CM | POA: Diagnosis not present

## 2023-07-18 DIAGNOSIS — L578 Other skin changes due to chronic exposure to nonionizing radiation: Secondary | ICD-10-CM | POA: Diagnosis not present

## 2023-07-18 DIAGNOSIS — L821 Other seborrheic keratosis: Secondary | ICD-10-CM | POA: Diagnosis not present

## 2023-07-18 DIAGNOSIS — D229 Melanocytic nevi, unspecified: Secondary | ICD-10-CM | POA: Diagnosis not present

## 2023-07-18 DIAGNOSIS — D485 Neoplasm of uncertain behavior of skin: Secondary | ICD-10-CM | POA: Diagnosis not present

## 2023-09-25 ENCOUNTER — Other Ambulatory Visit: Payer: Self-pay | Admitting: Obstetrics and Gynecology

## 2023-09-25 DIAGNOSIS — Z1231 Encounter for screening mammogram for malignant neoplasm of breast: Secondary | ICD-10-CM

## 2023-10-03 DIAGNOSIS — L905 Scar conditions and fibrosis of skin: Secondary | ICD-10-CM | POA: Diagnosis not present

## 2023-10-03 DIAGNOSIS — D239 Other benign neoplasm of skin, unspecified: Secondary | ICD-10-CM | POA: Diagnosis not present

## 2023-10-03 DIAGNOSIS — L814 Other melanin hyperpigmentation: Secondary | ICD-10-CM | POA: Diagnosis not present

## 2023-10-16 ENCOUNTER — Ambulatory Visit
Admission: RE | Admit: 2023-10-16 | Discharge: 2023-10-16 | Disposition: A | Discharge status: Home / Self Care | Payer: BC Managed Care – PPO | Source: Ambulatory Visit | Attending: Obstetrics and Gynecology | Admitting: Obstetrics and Gynecology

## 2023-10-16 DIAGNOSIS — Z1231 Encounter for screening mammogram for malignant neoplasm of breast: Secondary | ICD-10-CM

## 2023-10-22 ENCOUNTER — Encounter: Payer: Self-pay | Admitting: Obstetrics and Gynecology

## 2023-11-08 ENCOUNTER — Ambulatory Visit: Payer: BC Managed Care – PPO | Admitting: Obstetrics and Gynecology

## 2023-11-21 NOTE — Progress Notes (Signed)
 58 y.o. G0P0000 Married Other or two or more races female here for annual exam.  Would like to discuss hormones, blood work, and not being able to hold bladder as well.   Notes frequency.  No incontinence.  No leak with cough, laugh, sneeze, or exercise.   Has difficulty sleeping.  Some flashes, but not a lot.  Drinking water when she gets up.   Patient is followed for HRT, and would like to continue it.  She was on transdermal estrogen and changed to oral estrogen in 2023.  She takes Prometrium .   Wants fasting labs.    Sees dermatology regularly.   PHQ: 1  Mother died on her birthday last year. Father has liver cancer.  Husband, sister, and friends are supportive.  Is a real Tax inspector.   Married for 25 years.   Works out.   Husband had hip replacement and has had difficulty.   PCP: Patient, No Pcp Per   Patient's last menstrual period was 09/11/2017 (exact date).           Sexually active: Yes.    The current method of family planning is post menopausal status.    Menopausal hormone therapy:  Yes Exercising: Yes.     Weight lifting, yoga, kick boxing, walking Smoker:  no  OB History  Gravida Para Term Preterm AB Living  0 0 0 0 0 0  SAB IAB Ectopic Multiple Live Births  0 0 0 0 0     HEALTH MAINTENANCE: Last 2 paps:  11/08/22 neg, neg HR HPV, 10/20/19 neg, neg HR HPV History of abnormal Pap or positive HPV:  Yes, 09/27/17 ASC-US  Mammogram:   10/16/23 Breast Density Cat C, BIRADS Cat 1 neg  Colonoscopy:  06/30/16 - due in 2027. Bone Density:  n/a  Result  n/a   Immunization History  Administered Date(s) Administered   Tdap 01/14/2008, 09/27/2017      reports that she has never smoked. She has never used smokeless tobacco. She reports that she does not drink alcohol and does not use drugs.  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 90 tablet 3   Ibuprofen (ADVIL PO) as needed.     Multiple Vitamin (MULTIVITAMIN PO)      Multiple Vitamins-Minerals (ZINC PO)      progesterone  (PROMETRIUM ) 100 MG capsule Take 1 capsule (100 mg total) by mouth daily. 90 capsule 3   triamcinolone cream (KENALOG) 0.1 % SMARTSIG:1 Application Topical 2-3 Times Daily     No current facility-administered medications for this visit.    ALLERGIES: Latex  Family History  Problem Relation Age of Onset   Hypertrophic cardiomyopathy Mother    Hypertension Mother    Hypertension Father    Heart disease Father    Hypertrophic cardiomyopathy Maternal Grandfather        unsure if diagnosed   Heart failure Paternal Grandmother    Cancer Paternal Grandfather 29       renal or stomach   Hypertrophic cardiomyopathy Maternal Aunt     Review of Systems  All other systems reviewed and are negative.   PHYSICAL EXAM:  BP 122/84 (BP Location: Left Arm, Patient Position: Sitting)   Pulse 62   Ht 5\' 2"  (1.575 m)   Wt 111 lb (50.3 kg)   LMP 09/11/2017 (Exact Date)   SpO2 99%   BMI 20.30 kg/m     General appearance: alert, cooperative and appears stated age Head: normocephalic, without obvious abnormality, atraumatic Neck: no adenopathy, supple, symmetrical, trachea midline and thyroid  normal to inspection and  palpation Lungs: clear to auscultation bilaterally Breasts: normal appearance, no masses or tenderness, No nipple retraction or dimpling, No nipple discharge or bleeding, No axillary adenopathy Heart: regular rate and rhythm Abdomen: soft, non-tender; no masses, no organomegaly Extremities: extremities normal, atraumatic, no cyanosis or edema Skin: skin color, texture, turgor normal. No rashes or lesions.  Multiple pigmented nevi.  Lymph nodes: cervical, supraclavicular, and axillary nodes normal. Neurologic: grossly normal  Pelvic: External genitalia:  no lesions              No abnormal inguinal nodes palpated.              Urethra:  normal appearing urethra with no masses, tenderness or lesions              Bartholins and Skenes: normal                 Vagina: normal appearing vagina with normal color and discharge, no lesions              Cervix: no lesions.  Flush with cervix.               Pap taken: no Bimanual Exam:  Uterus:  normal size, contour, position, consistency, mobility, non-tender              Adnexa: no mass, fullness, tenderness              Rectal exam: yes.  Confirms.              Anus:  normal sphincter tone, no lesions  Chaperone was present for exam:   Cottie Diss, CMA  ASSESSMENT: Well woman visit with gynecologic exam. Hx recurrent vaginal dysplasia.   Status post laser vaporization for CIN III in 1090.  Status post Effudex for HGSIL in 1992.  Status post LEEP for CIN III 2008.  PHQ-9: 1 Pigmented nevi of skin.  FH hypertrophic cardiomyopathy.  Bereavement.   PLAN: Mammogram screening discussed. Self breast awareness reviewed. Pap and HRV collected:  no.  Due in 2027.  Guidelines for Calcium, Vitamin D , regular exercise program including cardiovascular and weight bearing exercise. Discused WHI and use of HRT which can increase risk of PE, DVT, MI, stroke and breast cancer.  Medication refills:  Estradiol  0.5 mg and Prometrium  100 mg q hs,  #90 each, 3  refills.  Lipids, CMP, CBC, TSH, vit D. She will establish care with PCP.  Brochure on counseling through [facility].  Follow up:  yearly and prn.

## 2023-12-05 ENCOUNTER — Encounter: Payer: Self-pay | Admitting: Obstetrics and Gynecology

## 2023-12-05 ENCOUNTER — Ambulatory Visit (INDEPENDENT_AMBULATORY_CARE_PROVIDER_SITE_OTHER): Admitting: Obstetrics and Gynecology

## 2023-12-05 VITALS — BP 122/84 | HR 62 | Ht 62.0 in | Wt 111.0 lb

## 2023-12-05 DIAGNOSIS — Z1331 Encounter for screening for depression: Secondary | ICD-10-CM | POA: Diagnosis not present

## 2023-12-05 DIAGNOSIS — Z0000 Encounter for general adult medical examination without abnormal findings: Secondary | ICD-10-CM | POA: Diagnosis not present

## 2023-12-05 DIAGNOSIS — Z01419 Encounter for gynecological examination (general) (routine) without abnormal findings: Secondary | ICD-10-CM

## 2023-12-05 DIAGNOSIS — E785 Hyperlipidemia, unspecified: Secondary | ICD-10-CM | POA: Diagnosis not present

## 2023-12-05 DIAGNOSIS — Z79890 Hormone replacement therapy (postmenopausal): Secondary | ICD-10-CM | POA: Diagnosis not present

## 2023-12-05 DIAGNOSIS — R61 Generalized hyperhidrosis: Secondary | ICD-10-CM | POA: Diagnosis not present

## 2023-12-05 DIAGNOSIS — N951 Menopausal and female climacteric states: Secondary | ICD-10-CM | POA: Diagnosis not present

## 2023-12-05 MED ORDER — PROGESTERONE MICRONIZED 100 MG PO CAPS: 100.0000 mg | ORAL_CAPSULE | Freq: Every day | ORAL | 3 refills | Status: AC

## 2023-12-05 MED ORDER — ESTRADIOL 0.5 MG PO TABS
0.5000 mg | ORAL_TABLET | Freq: Every day | ORAL | 3 refills | Status: AC
Start: 2023-12-05 — End: ?

## 2023-12-05 NOTE — Patient Instructions (Signed)

## 2023-12-06 ENCOUNTER — Encounter: Payer: Self-pay | Admitting: Obstetrics and Gynecology

## 2023-12-06 LAB — VITAMIN D 25 HYDROXY (VIT D DEFICIENCY, FRACTURES): Vit D, 25-Hydroxy: 65 ng/mL (ref 30–100)

## 2023-12-06 LAB — COMPREHENSIVE METABOLIC PANEL WITH GFR
AG Ratio: 2 (calc) (ref 1.0–2.5)
ALT: 17 U/L (ref 6–29)
AST: 19 U/L (ref 10–35)
Albumin: 4.3 g/dL (ref 3.6–5.1)
Alkaline phosphatase (APISO): 54 U/L (ref 37–153)
BUN: 19 mg/dL (ref 7–25)
CO2: 26 mmol/L (ref 20–32)
Calcium: 9.1 mg/dL (ref 8.6–10.4)
Chloride: 102 mmol/L (ref 98–110)
Creat: 0.8 mg/dL (ref 0.50–1.03)
Globulin: 2.2 g/dL (ref 1.9–3.7)
Glucose, Bld: 79 mg/dL (ref 65–99)
Potassium: 4 mmol/L (ref 3.5–5.3)
Sodium: 138 mmol/L (ref 135–146)
Total Bilirubin: 0.9 mg/dL (ref 0.2–1.2)
Total Protein: 6.5 g/dL (ref 6.1–8.1)
eGFR: 85 mL/min/{1.73_m2} (ref >=60)

## 2023-12-06 LAB — LIPID PANEL
Cholesterol: 165 mg/dL (ref <200)
HDL: 56 mg/dL (ref >=50)
LDL Cholesterol (Calc): 94 mg/dL
Non-HDL Cholesterol (Calc): 109 mg/dL (ref <130)
Total CHOL/HDL Ratio: 2.9 (calc) (ref <5.0)
Triglycerides: 64 mg/dL (ref <150)

## 2023-12-06 LAB — CBC
HCT: 44.8 % (ref 35.0–45.0)
Hemoglobin: 14.4 g/dL (ref 11.7–15.5)
MCH: 28.5 pg (ref 27.0–33.0)
MCHC: 32.1 g/dL (ref 32.0–36.0)
MCV: 88.7 fL (ref 80.0–100.0)
MPV: 10.8 fL (ref 7.5–12.5)
Platelets: 214 10*3/uL (ref 140–400)
RBC: 5.05 10*6/uL (ref 3.80–5.10)
RDW: 12.5 % (ref 11.0–15.0)
WBC: 6.2 10*3/uL (ref 3.8–10.8)

## 2023-12-06 LAB — TSH: TSH: 0.78 m[IU]/L (ref 0.40–4.50)

## 2024-03-04 DIAGNOSIS — H524 Presbyopia: Secondary | ICD-10-CM | POA: Diagnosis not present

## 2024-03-04 DIAGNOSIS — H04123 Dry eye syndrome of bilateral lacrimal glands: Secondary | ICD-10-CM | POA: Diagnosis not present

## 2024-03-04 DIAGNOSIS — H25013 Cortical age-related cataract, bilateral: Secondary | ICD-10-CM | POA: Diagnosis not present

## 2024-08-21 ENCOUNTER — Ambulatory Visit: Admitting: Sports Medicine

## 2024-09-10 ENCOUNTER — Other Ambulatory Visit: Payer: Self-pay | Admitting: Obstetrics and Gynecology

## 2024-09-10 DIAGNOSIS — Z1231 Encounter for screening mammogram for malignant neoplasm of breast: Secondary | ICD-10-CM

## 2024-11-04 ENCOUNTER — Ambulatory Visit

## 2024-12-09 ENCOUNTER — Ambulatory Visit: Admitting: Obstetrics and Gynecology
